# Patient Record
Sex: Female | Born: 1985 | Race: Black or African American | Hispanic: No | Marital: Single | Smoking: Current some day smoker
Health system: Southern US, Community
[De-identification: ages and names within clinical notes are randomized; demographics above are authoritative.]

## PROBLEM LIST (undated history)

## (undated) DIAGNOSIS — F1490 Cocaine use, unspecified, uncomplicated: Secondary | ICD-10-CM

## (undated) DIAGNOSIS — J45909 Unspecified asthma, uncomplicated: Secondary | ICD-10-CM

## (undated) DIAGNOSIS — Z789 Other specified health status: Secondary | ICD-10-CM

## (undated) HISTORY — PX: NO PAST SURGERIES: SHX2092

## (undated) HISTORY — DX: Unspecified asthma, uncomplicated: J45.909

---
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
## 2004-01-07 ENCOUNTER — Emergency Department (HOSPITAL_COMMUNITY): Admission: EM | Admit: 2004-01-07 | Discharge: 2004-01-07 | Payer: Self-pay | Admitting: Family Medicine

## 2004-02-10 ENCOUNTER — Emergency Department (HOSPITAL_COMMUNITY): Admission: EM | Admit: 2004-02-10 | Discharge: 2004-02-10 | Payer: Self-pay | Admitting: Family Medicine

## 2004-06-29 ENCOUNTER — Emergency Department (HOSPITAL_COMMUNITY): Admission: EM | Admit: 2004-06-29 | Discharge: 2004-06-29 | Payer: Self-pay | Admitting: Emergency Medicine

## 2004-07-16 ENCOUNTER — Emergency Department (HOSPITAL_COMMUNITY): Admission: EM | Admit: 2004-07-16 | Discharge: 2004-07-16 | Payer: Self-pay | Admitting: Family Medicine

## 2004-09-18 ENCOUNTER — Emergency Department (HOSPITAL_COMMUNITY): Admission: EM | Admit: 2004-09-18 | Discharge: 2004-09-19 | Payer: Self-pay | Admitting: *Deleted

## 2004-09-25 ENCOUNTER — Emergency Department (HOSPITAL_COMMUNITY): Admission: EM | Admit: 2004-09-25 | Discharge: 2004-09-25 | Payer: Self-pay | Admitting: Emergency Medicine

## 2005-03-03 ENCOUNTER — Emergency Department (HOSPITAL_COMMUNITY): Admission: EM | Admit: 2005-03-03 | Discharge: 2005-03-03 | Payer: Self-pay | Admitting: Emergency Medicine

## 2005-04-21 ENCOUNTER — Inpatient Hospital Stay (HOSPITAL_COMMUNITY): Admission: AD | Admit: 2005-04-21 | Discharge: 2005-04-21 | Payer: Self-pay | Admitting: Obstetrics

## 2005-05-18 ENCOUNTER — Emergency Department (HOSPITAL_COMMUNITY): Admission: EM | Admit: 2005-05-18 | Discharge: 2005-05-19 | Payer: Self-pay | Admitting: Emergency Medicine

## 2005-05-19 ENCOUNTER — Inpatient Hospital Stay (HOSPITAL_COMMUNITY): Admission: AD | Admit: 2005-05-19 | Discharge: 2005-05-20 | Payer: Self-pay | Admitting: Obstetrics

## 2005-06-14 ENCOUNTER — Inpatient Hospital Stay (HOSPITAL_COMMUNITY): Admission: AD | Admit: 2005-06-14 | Discharge: 2005-06-14 | Payer: Self-pay | Admitting: Obstetrics

## 2005-06-23 ENCOUNTER — Inpatient Hospital Stay (HOSPITAL_COMMUNITY): Admission: AD | Admit: 2005-06-23 | Discharge: 2005-06-23 | Payer: Self-pay | Admitting: Obstetrics

## 2005-07-12 ENCOUNTER — Inpatient Hospital Stay (HOSPITAL_COMMUNITY): Admission: AD | Admit: 2005-07-12 | Discharge: 2005-07-12 | Payer: Self-pay | Admitting: Obstetrics

## 2005-08-05 ENCOUNTER — Inpatient Hospital Stay (HOSPITAL_COMMUNITY): Admission: AD | Admit: 2005-08-05 | Discharge: 2005-08-05 | Payer: Self-pay | Admitting: Obstetrics

## 2005-08-30 ENCOUNTER — Inpatient Hospital Stay (HOSPITAL_COMMUNITY): Admission: AD | Admit: 2005-08-30 | Discharge: 2005-08-30 | Payer: Self-pay | Admitting: Obstetrics

## 2005-09-01 ENCOUNTER — Inpatient Hospital Stay (HOSPITAL_COMMUNITY): Admission: AD | Admit: 2005-09-01 | Discharge: 2005-09-02 | Payer: Self-pay | Admitting: Obstetrics

## 2005-10-07 ENCOUNTER — Observation Stay (HOSPITAL_COMMUNITY): Admission: AD | Admit: 2005-10-07 | Discharge: 2005-10-07 | Payer: Self-pay | Admitting: Obstetrics

## 2005-10-13 ENCOUNTER — Inpatient Hospital Stay (HOSPITAL_COMMUNITY): Admission: AD | Admit: 2005-10-13 | Discharge: 2005-10-13 | Payer: Self-pay | Admitting: Obstetrics

## 2005-10-19 ENCOUNTER — Inpatient Hospital Stay (HOSPITAL_COMMUNITY): Admission: AD | Admit: 2005-10-19 | Discharge: 2005-10-19 | Payer: Self-pay

## 2005-10-25 ENCOUNTER — Inpatient Hospital Stay (HOSPITAL_COMMUNITY): Admission: AD | Admit: 2005-10-25 | Discharge: 2005-10-27 | Payer: Self-pay | Admitting: Obstetrics

## 2006-05-31 ENCOUNTER — Emergency Department (HOSPITAL_COMMUNITY): Admission: EM | Admit: 2006-05-31 | Discharge: 2006-05-31 | Payer: Self-pay | Admitting: *Deleted

## 2006-07-11 ENCOUNTER — Emergency Department (HOSPITAL_COMMUNITY): Admission: EM | Admit: 2006-07-11 | Discharge: 2006-07-11 | Payer: Self-pay | Admitting: Emergency Medicine

## 2006-07-15 ENCOUNTER — Emergency Department (HOSPITAL_COMMUNITY): Admission: EM | Admit: 2006-07-15 | Discharge: 2006-07-15 | Payer: Self-pay | Admitting: Family Medicine

## 2006-08-27 ENCOUNTER — Emergency Department (HOSPITAL_COMMUNITY): Admission: EM | Admit: 2006-08-27 | Discharge: 2006-08-28 | Payer: Self-pay | Admitting: Emergency Medicine

## 2006-09-08 ENCOUNTER — Emergency Department (HOSPITAL_COMMUNITY): Admission: EM | Admit: 2006-09-08 | Discharge: 2006-09-08 | Payer: Self-pay | Admitting: Family Medicine

## 2006-11-16 ENCOUNTER — Emergency Department (HOSPITAL_COMMUNITY): Admission: EM | Admit: 2006-11-16 | Discharge: 2006-11-16 | Payer: Self-pay | Admitting: *Deleted

## 2007-06-04 ENCOUNTER — Emergency Department (HOSPITAL_COMMUNITY): Admission: EM | Admit: 2007-06-04 | Discharge: 2007-06-04 | Payer: Self-pay | Admitting: Family Medicine

## 2007-06-25 ENCOUNTER — Emergency Department (HOSPITAL_COMMUNITY): Admission: EM | Admit: 2007-06-25 | Discharge: 2007-06-25 | Payer: Self-pay | Admitting: Emergency Medicine

## 2007-08-12 ENCOUNTER — Emergency Department (HOSPITAL_COMMUNITY): Admission: EM | Admit: 2007-08-12 | Discharge: 2007-08-12 | Payer: Self-pay | Admitting: Emergency Medicine

## 2007-10-18 ENCOUNTER — Emergency Department (HOSPITAL_COMMUNITY): Admission: EM | Admit: 2007-10-18 | Discharge: 2007-10-19 | Payer: Self-pay | Admitting: Emergency Medicine

## 2008-01-23 ENCOUNTER — Emergency Department (HOSPITAL_COMMUNITY): Admission: EM | Admit: 2008-01-23 | Discharge: 2008-01-23 | Payer: Self-pay | Admitting: Emergency Medicine

## 2008-02-09 ENCOUNTER — Emergency Department (HOSPITAL_COMMUNITY): Admission: EM | Admit: 2008-02-09 | Discharge: 2008-02-10 | Payer: Self-pay | Admitting: Emergency Medicine

## 2008-05-20 ENCOUNTER — Emergency Department (HOSPITAL_COMMUNITY): Admission: EM | Admit: 2008-05-20 | Discharge: 2008-05-20 | Payer: Self-pay | Admitting: Emergency Medicine

## 2008-08-07 ENCOUNTER — Emergency Department (HOSPITAL_COMMUNITY): Admission: EM | Admit: 2008-08-07 | Discharge: 2008-08-07 | Payer: Self-pay | Admitting: Emergency Medicine

## 2008-08-15 ENCOUNTER — Emergency Department (HOSPITAL_COMMUNITY): Admission: EM | Admit: 2008-08-15 | Discharge: 2008-08-15 | Payer: Self-pay | Admitting: Emergency Medicine

## 2008-10-11 ENCOUNTER — Emergency Department (HOSPITAL_COMMUNITY): Admission: EM | Admit: 2008-10-11 | Discharge: 2008-10-11 | Payer: Self-pay | Admitting: Emergency Medicine

## 2008-11-16 ENCOUNTER — Inpatient Hospital Stay (HOSPITAL_COMMUNITY): Admission: AD | Admit: 2008-11-16 | Discharge: 2008-11-16 | Payer: Self-pay | Admitting: Obstetrics and Gynecology

## 2008-11-28 ENCOUNTER — Ambulatory Visit (HOSPITAL_COMMUNITY): Admission: RE | Admit: 2008-11-28 | Discharge: 2008-11-28 | Payer: Self-pay | Admitting: Obstetrics & Gynecology

## 2008-12-11 ENCOUNTER — Ambulatory Visit (HOSPITAL_COMMUNITY): Admission: RE | Admit: 2008-12-11 | Discharge: 2008-12-11 | Payer: Self-pay | Admitting: Obstetrics & Gynecology

## 2008-12-16 ENCOUNTER — Inpatient Hospital Stay (HOSPITAL_COMMUNITY): Admission: AD | Admit: 2008-12-16 | Discharge: 2008-12-16 | Payer: Self-pay | Admitting: Obstetrics & Gynecology

## 2009-01-03 ENCOUNTER — Ambulatory Visit (HOSPITAL_COMMUNITY): Admission: RE | Admit: 2009-01-03 | Discharge: 2009-01-03 | Payer: Self-pay | Admitting: Obstetrics & Gynecology

## 2009-01-15 ENCOUNTER — Inpatient Hospital Stay (HOSPITAL_COMMUNITY): Admission: AD | Admit: 2009-01-15 | Discharge: 2009-01-15 | Payer: Self-pay | Admitting: Obstetrics & Gynecology

## 2009-03-10 ENCOUNTER — Ambulatory Visit: Payer: Self-pay | Admitting: Obstetrics and Gynecology

## 2009-03-10 ENCOUNTER — Inpatient Hospital Stay (HOSPITAL_COMMUNITY): Admission: AD | Admit: 2009-03-10 | Discharge: 2009-03-10 | Payer: Self-pay | Admitting: Obstetrics & Gynecology

## 2009-10-09 ENCOUNTER — Emergency Department (HOSPITAL_COMMUNITY): Admission: EM | Admit: 2009-10-09 | Discharge: 2009-10-09 | Payer: Self-pay | Admitting: Family Medicine

## 2010-04-13 ENCOUNTER — Emergency Department (HOSPITAL_COMMUNITY)
Admission: EM | Admit: 2010-04-13 | Discharge: 2010-04-13 | Payer: Self-pay | Source: Home / Self Care | Admitting: Emergency Medicine

## 2010-07-06 LAB — URINALYSIS, ROUTINE W REFLEX MICROSCOPIC
Nitrite: NEGATIVE
Urobilinogen, UA: 1 mg/dL (ref 0.0–1.0)

## 2010-07-06 LAB — URINE MICROSCOPIC-ADD ON

## 2010-07-06 LAB — WET PREP, GENITAL
Trich, Wet Prep: NONE SEEN
Yeast Wet Prep HPF POC: NONE SEEN

## 2010-07-06 LAB — GC/CHLAMYDIA PROBE AMP, GENITAL: Chlamydia, DNA Probe: POSITIVE — AB

## 2010-07-06 LAB — POCT PREGNANCY, URINE: Preg Test, Ur: NEGATIVE

## 2010-07-29 LAB — URINALYSIS, ROUTINE W REFLEX MICROSCOPIC
Bilirubin Urine: NEGATIVE
Glucose, UA: NEGATIVE mg/dL
Ketones, ur: NEGATIVE mg/dL
Leukocytes, UA: NEGATIVE
Nitrite: NEGATIVE
Protein, ur: NEGATIVE mg/dL
Specific Gravity, Urine: 1.01 (ref 1.005–1.030)
Urobilinogen, UA: 0.2 mg/dL (ref 0.0–1.0)
pH: 7 (ref 5.0–8.0)

## 2010-07-29 LAB — RAPID URINE DRUG SCREEN, HOSP PERFORMED
Amphetamines: NOT DETECTED
Barbiturates: NOT DETECTED
Benzodiazepines: NOT DETECTED
Cocaine: POSITIVE — AB
Opiates: NOT DETECTED
Tetrahydrocannabinol: POSITIVE — AB

## 2010-07-29 LAB — URINE MICROSCOPIC-ADD ON

## 2010-07-31 LAB — URINALYSIS, ROUTINE W REFLEX MICROSCOPIC
Bilirubin Urine: NEGATIVE
Ketones, ur: NEGATIVE mg/dL
Nitrite: NEGATIVE
Protein, ur: NEGATIVE mg/dL
Urobilinogen, UA: 0.2 mg/dL (ref 0.0–1.0)

## 2010-08-01 LAB — WET PREP, GENITAL
Trich, Wet Prep: NONE SEEN
Yeast Wet Prep HPF POC: NONE SEEN

## 2010-08-01 LAB — URINALYSIS, ROUTINE W REFLEX MICROSCOPIC
Bilirubin Urine: NEGATIVE
Glucose, UA: NEGATIVE mg/dL
Ketones, ur: NEGATIVE mg/dL
Leukocytes, UA: NEGATIVE
Nitrite: NEGATIVE
Protein, ur: NEGATIVE mg/dL
Urobilinogen, UA: 0.2 mg/dL (ref 0.0–1.0)
pH: 7 (ref 5.0–8.0)

## 2010-08-01 LAB — URINE MICROSCOPIC-ADD ON

## 2010-08-02 LAB — URINALYSIS, ROUTINE W REFLEX MICROSCOPIC
Bilirubin Urine: NEGATIVE
Glucose, UA: NEGATIVE mg/dL
Ketones, ur: 15 mg/dL — AB
Nitrite: NEGATIVE
pH: 6.5 (ref 5.0–8.0)

## 2010-08-02 LAB — COMPREHENSIVE METABOLIC PANEL
ALT: 13 U/L (ref 0–35)
Alkaline Phosphatase: 56 U/L (ref 39–117)
CO2: 25 mEq/L (ref 19–32)
GFR calc non Af Amer: >60 mL/min (ref >60)
Glucose, Bld: 79 mg/dL (ref 70–99)
Potassium: 4 mEq/L (ref 3.5–5.1)
Sodium: 135 mEq/L (ref 135–145)
Total Bilirubin: 0.4 mg/dL (ref 0.3–1.2)

## 2010-08-02 LAB — URINE MICROSCOPIC-ADD ON

## 2010-08-02 LAB — CBC
Hemoglobin: 11.8 g/dL — ABNORMAL LOW (ref 12.0–15.0)
RBC: 4.16 MIL/uL (ref 3.87–5.11)

## 2010-08-03 LAB — URINALYSIS, ROUTINE W REFLEX MICROSCOPIC
Bilirubin Urine: NEGATIVE
Ketones, ur: 15 mg/dL — AB
Ketones, ur: 15 mg/dL — AB
Nitrite: NEGATIVE
Nitrite: NEGATIVE
Protein, ur: 30 mg/dL — AB
Protein, ur: 30 mg/dL — AB
Specific Gravity, Urine: 1.031 — ABNORMAL HIGH (ref 1.005–1.030)
Urobilinogen, UA: 1 mg/dL (ref 0.0–1.0)

## 2010-08-03 LAB — DIFFERENTIAL
Basophils Absolute: 0.1 10*3/uL (ref 0.0–0.1)
Basophils Relative: 2 % — ABNORMAL HIGH (ref 0–1)
Eosinophils Absolute: 0 10*3/uL (ref 0.0–0.7)
Eosinophils Relative: 0 % (ref 0–5)
Monocytes Absolute: 0.5 10*3/uL (ref 0.1–1.0)
Neutro Abs: 3.9 10*3/uL (ref 1.7–7.7)

## 2010-08-03 LAB — URINE MICROSCOPIC-ADD ON

## 2010-08-03 LAB — URINE CULTURE: Colony Count: >=100000

## 2010-08-03 LAB — GC/CHLAMYDIA PROBE AMP, GENITAL
Chlamydia, DNA Probe: NEGATIVE
GC Probe Amp, Genital: NEGATIVE

## 2010-08-03 LAB — CBC
Hemoglobin: 11.9 g/dL — ABNORMAL LOW (ref 12.0–15.0)
MCHC: 32.2 g/dL (ref 30.0–36.0)
RDW: 15 % (ref 11.5–15.5)

## 2010-08-03 LAB — WET PREP, GENITAL: Trich, Wet Prep: NONE SEEN

## 2010-08-05 LAB — ABO/RH: ABO/RH(D): A POS

## 2010-08-05 LAB — URINALYSIS, ROUTINE W REFLEX MICROSCOPIC
Ketones, ur: 15 mg/dL — AB
Leukocytes, UA: NEGATIVE
Protein, ur: 30 mg/dL — AB
Urobilinogen, UA: 0.2 mg/dL (ref 0.0–1.0)

## 2010-08-05 LAB — URINE MICROSCOPIC-ADD ON

## 2010-08-05 LAB — GC/CHLAMYDIA PROBE AMP, GENITAL: GC Probe Amp, Genital: POSITIVE — AB

## 2011-01-15 LAB — POCT RAPID STREP A: Streptococcus, Group A Screen (Direct): POSITIVE — AB

## 2011-01-18 LAB — HEPATIC FUNCTION PANEL
Alkaline Phosphatase: 56
Total Bilirubin: 0.2 — ABNORMAL LOW

## 2011-01-18 LAB — DIFFERENTIAL
Basophils Relative: 0
Eosinophils Absolute: 0.2
Eosinophils Relative: 3
Lymphs Abs: 3.1
Monocytes Relative: 5
Neutrophils Relative %: 49

## 2011-01-18 LAB — CBC
HCT: 36.9
MCHC: 32
MCV: 83.7
Platelets: 293
RBC: 4.41
WBC: 7.3

## 2011-01-18 LAB — POCT I-STAT CREATININE
Creatinine, Ser: 1.3 — ABNORMAL HIGH
Operator id: 277751

## 2011-01-18 LAB — I-STAT 8, (EC8 V) (CONVERTED LAB)
Acid-base deficit: 2
BUN: 11
Bicarbonate: 22.6
Chloride: 105
Glucose, Bld: 74
HCT: 41
Hemoglobin: 13.9
Operator id: 277751
Potassium: 3.6
Sodium: 138
TCO2: 24
pCO2, Ven: 37.5 — ABNORMAL LOW
pH, Ven: 7.387 — ABNORMAL HIGH

## 2011-01-18 LAB — URINALYSIS, ROUTINE W REFLEX MICROSCOPIC
Bilirubin Urine: NEGATIVE
Leukocytes, UA: NEGATIVE
Nitrite: NEGATIVE
Specific Gravity, Urine: 1.031 — ABNORMAL HIGH
Urobilinogen, UA: 0.2
pH: 5.5

## 2011-01-18 LAB — LIPASE, BLOOD: Lipase: 18

## 2011-01-18 LAB — URINE MICROSCOPIC-ADD ON

## 2011-01-19 LAB — POCT INFECTIOUS MONO SCREEN: Mono Screen: NEGATIVE

## 2011-01-21 LAB — DIFFERENTIAL
Basophils Absolute: 0
Basophils Relative: 0
Monocytes Absolute: 0.9
Neutro Abs: 10.1 — ABNORMAL HIGH

## 2011-01-21 LAB — URINE CULTURE

## 2011-01-21 LAB — CBC
HCT: 32.9 — ABNORMAL LOW
Hemoglobin: 10.9 — ABNORMAL LOW
Platelets: 296
RBC: 3.95
WBC: 11.8 — ABNORMAL HIGH

## 2011-01-21 LAB — COMPREHENSIVE METABOLIC PANEL
Albumin: 3.3 — ABNORMAL LOW
Alkaline Phosphatase: 57
BUN: 6
CO2: 25
Chloride: 106
GFR calc non Af Amer: >60
Glucose, Bld: 94
Potassium: 3.4 — ABNORMAL LOW
Total Bilirubin: 0.5

## 2011-01-21 LAB — URINALYSIS, ROUTINE W REFLEX MICROSCOPIC
Bilirubin Urine: NEGATIVE
Ketones, ur: NEGATIVE
Nitrite: POSITIVE — AB
Specific Gravity, Urine: 1.012
Urobilinogen, UA: 1

## 2011-01-26 LAB — RAPID STREP SCREEN (MED CTR MEBANE ONLY): Streptococcus, Group A Screen (Direct): POSITIVE — AB

## 2011-02-08 LAB — CBC
HCT: 39.8
Hemoglobin: 13.3
MCHC: 33.5
MCV: 81.7
Platelets: 340
RBC: 4.86
RDW: 15.4 — ABNORMAL HIGH
WBC: 9.7

## 2011-02-08 LAB — POCT I-STAT CREATININE: Operator id: 272551

## 2011-02-08 LAB — DIFFERENTIAL
Basophils Absolute: 0
Basophils Relative: 1
Eosinophils Absolute: 0.1
Eosinophils Relative: 1
Lymphocytes Relative: 22
Lymphs Abs: 2.1
Monocytes Absolute: 0.7
Monocytes Relative: 7
Neutro Abs: 6.7
Neutrophils Relative %: 69

## 2011-02-08 LAB — I-STAT 8, (EC8 V) (CONVERTED LAB)
Bicarbonate: 23.1
Glucose, Bld: 93
TCO2: 24
pH, Ven: 7.538 — ABNORMAL HIGH

## 2018-04-26 NOTE — L&D Delivery Note (Addendum)
Delivery Note At 8:37 PM a viable female was delivered via Vaginal, Spontaneous (Presentation: ROA).  APGAR: 9, 9; weight pending.   Placenta status: spontaneous and intact,  Cord: 3 vessel cord, nuchal x2 and body. Delivered through and reduced after delivery  Anesthesia:  none Episiotomy: None Lacerations:  None  Suture Repair: n/a Est. Blood Loss (mL):  177  Mom to postpartum.  Baby to Couplet care / Skin to Skin.  Lattie Haw MD 01/14/2019, 8:55 PM  Patient is a 33 y.o. at [redacted]w[redacted]d who was admitted spontaneous onset of labor, significant hx of limited prenatal care, hx PTD x3 with one 24w demise.  She progressed without augmentation.  I was gloved and present for delivery in its entirety.  Second stage of labor progressed, baby delivered after 30 minutes of pushing.   Complications: nuchal x2, body x1  Lacerations: none  Wende Mott, CNM 11:29 PM

## 2018-12-19 ENCOUNTER — Encounter (HOSPITAL_COMMUNITY): Payer: Self-pay

## 2018-12-19 ENCOUNTER — Inpatient Hospital Stay (HOSPITAL_COMMUNITY)
Admission: AD | Admit: 2018-12-19 | Discharge: 2018-12-19 | Disposition: A | Discharge status: Home / Self Care | Payer: Medicaid Other | Attending: Obstetrics and Gynecology | Admitting: Obstetrics and Gynecology

## 2018-12-19 ENCOUNTER — Inpatient Hospital Stay (HOSPITAL_BASED_OUTPATIENT_CLINIC_OR_DEPARTMENT_OTHER): Payer: Medicaid Other

## 2018-12-19 ENCOUNTER — Other Ambulatory Visit: Payer: Self-pay

## 2018-12-19 DIAGNOSIS — Z880 Allergy status to penicillin: Secondary | ICD-10-CM | POA: Diagnosis not present

## 2018-12-19 DIAGNOSIS — O99323 Drug use complicating pregnancy, third trimester: Secondary | ICD-10-CM

## 2018-12-19 DIAGNOSIS — Z3A35 35 weeks gestation of pregnancy: Secondary | ICD-10-CM

## 2018-12-19 DIAGNOSIS — Z8249 Family history of ischemic heart disease and other diseases of the circulatory system: Secondary | ICD-10-CM | POA: Diagnosis not present

## 2018-12-19 DIAGNOSIS — O09523 Supervision of elderly multigravida, third trimester: Secondary | ICD-10-CM

## 2018-12-19 DIAGNOSIS — F17210 Nicotine dependence, cigarettes, uncomplicated: Secondary | ICD-10-CM | POA: Insufficient documentation

## 2018-12-19 DIAGNOSIS — O4703 False labor before 37 completed weeks of gestation, third trimester: Secondary | ICD-10-CM | POA: Diagnosis not present

## 2018-12-19 DIAGNOSIS — O09213 Supervision of pregnancy with history of pre-term labor, third trimester: Secondary | ICD-10-CM

## 2018-12-19 DIAGNOSIS — O99333 Smoking (tobacco) complicating pregnancy, third trimester: Secondary | ICD-10-CM

## 2018-12-19 DIAGNOSIS — N939 Abnormal uterine and vaginal bleeding, unspecified: Secondary | ICD-10-CM | POA: Diagnosis present

## 2018-12-19 DIAGNOSIS — O479 False labor, unspecified: Secondary | ICD-10-CM

## 2018-12-19 DIAGNOSIS — Z883 Allergy status to other anti-infective agents status: Secondary | ICD-10-CM | POA: Insufficient documentation

## 2018-12-19 DIAGNOSIS — O4700 False labor before 37 completed weeks of gestation, unspecified trimester: Secondary | ICD-10-CM

## 2018-12-19 HISTORY — DX: Other specified health status: Z78.9

## 2018-12-19 LAB — URINALYSIS, ROUTINE W REFLEX MICROSCOPIC
Bilirubin Urine: NEGATIVE
Glucose, UA: NEGATIVE mg/dL
Hgb urine dipstick: NEGATIVE
Ketones, ur: NEGATIVE mg/dL
Leukocytes,Ua: NEGATIVE
Nitrite: NEGATIVE
Protein, ur: NEGATIVE mg/dL
Specific Gravity, Urine: 1.008 (ref 1.005–1.030)
pH: 7 (ref 5.0–8.0)

## 2018-12-19 LAB — CBC
HCT: 30.5 % — ABNORMAL LOW (ref 36.0–46.0)
Hemoglobin: 9.9 g/dL — ABNORMAL LOW (ref 12.0–15.0)
MCH: 26.2 pg (ref 26.0–34.0)
MCHC: 32.5 g/dL (ref 30.0–36.0)
MCV: 80.7 fL (ref 80.0–100.0)
Platelets: 244 10*3/uL (ref 150–400)
RBC: 3.78 MIL/uL — ABNORMAL LOW (ref 3.87–5.11)
RDW: 13.9 % (ref 11.5–15.5)
WBC: 7.3 10*3/uL (ref 4.0–10.5)
nRBC: 0 % (ref 0.0–0.2)

## 2018-12-19 LAB — DIFFERENTIAL
Abs Immature Granulocytes: 0.05 10*3/uL (ref 0.00–0.07)
Basophils Absolute: 0 10*3/uL (ref 0.0–0.1)
Basophils Relative: 0 %
Eosinophils Absolute: 0.1 10*3/uL (ref 0.0–0.5)
Eosinophils Relative: 2 %
Immature Granulocytes: 1 %
Lymphocytes Relative: 22 %
Lymphs Abs: 1.6 10*3/uL (ref 0.7–4.0)
Monocytes Absolute: 0.7 10*3/uL (ref 0.1–1.0)
Monocytes Relative: 9 %
Neutro Abs: 4.8 10*3/uL (ref 1.7–7.7)
Neutrophils Relative %: 66 %

## 2018-12-19 LAB — RAPID HIV SCREEN (HIV 1/2 AB+AG)
HIV 1/2 Antibodies: NONREACTIVE
HIV-1 P24 Antigen - HIV24: NONREACTIVE

## 2018-12-19 LAB — TYPE AND SCREEN
ABO/RH(D): A POS
Antibody Screen: NEGATIVE

## 2018-12-19 LAB — WET PREP, GENITAL
Sperm: NONE SEEN
Trich, Wet Prep: NONE SEEN
Yeast Wet Prep HPF POC: NONE SEEN

## 2018-12-19 LAB — RAPID URINE DRUG SCREEN, HOSP PERFORMED
Amphetamines: NOT DETECTED
Barbiturates: NOT DETECTED
Benzodiazepines: NOT DETECTED
Cocaine: NOT DETECTED
Opiates: NOT DETECTED
Tetrahydrocannabinol: POSITIVE — AB

## 2018-12-19 IMAGING — US US MFM OB DETAIL +14 WK
1 series · 13 of 28 positions shown · non-contrast
Comparison: none

[Series 1: us mfm ob detail +14 wk · 84 acquisitions, 13 frames shown]
[im 4/84]
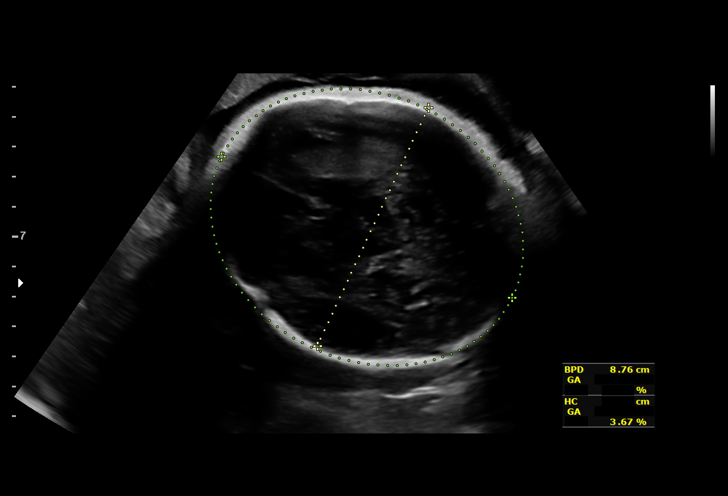
[im 10/84]
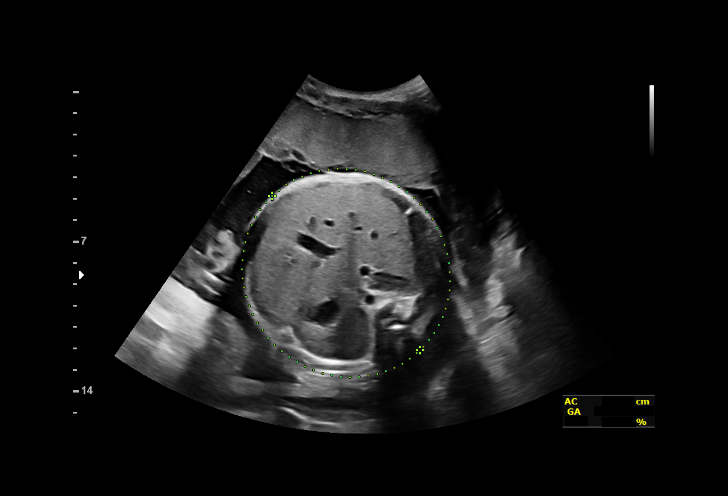
[im 16/84]
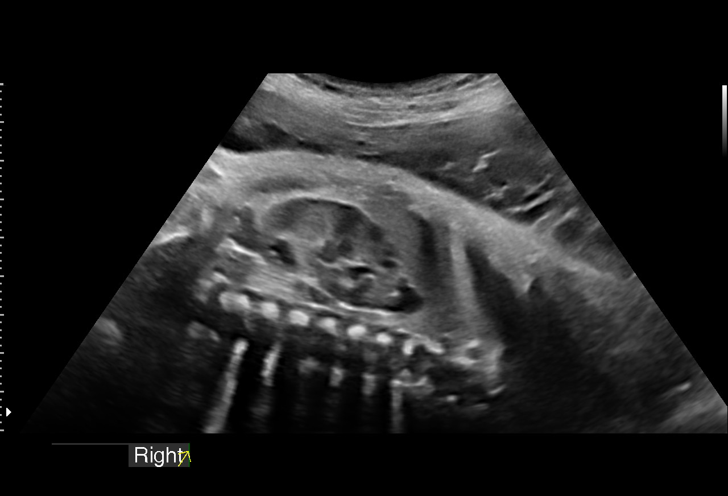
[im 22/84]
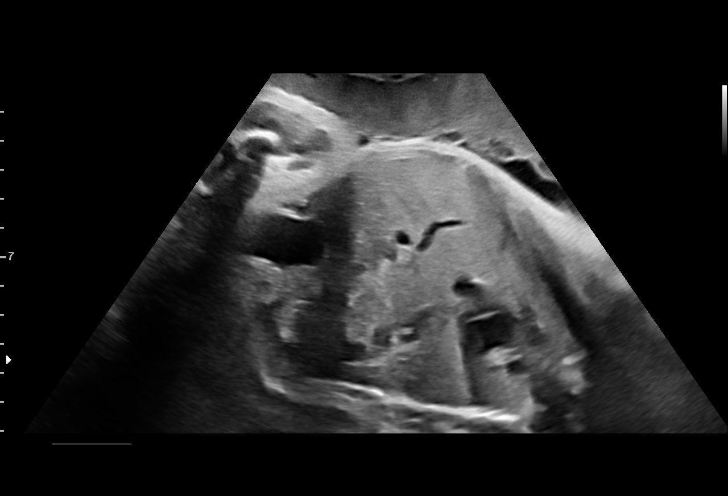
[im 28/84]
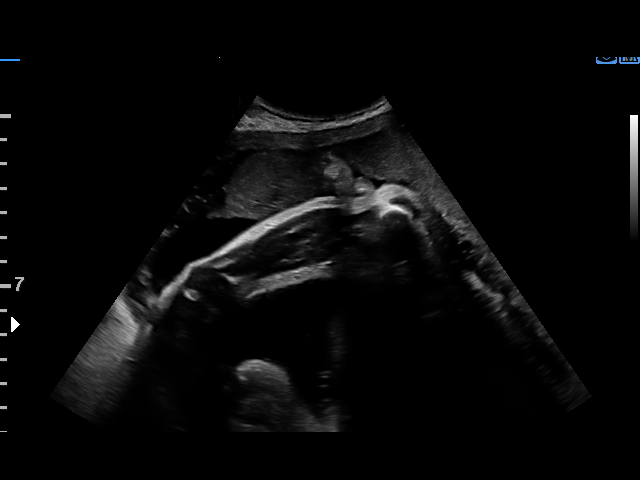
[im 34/84]
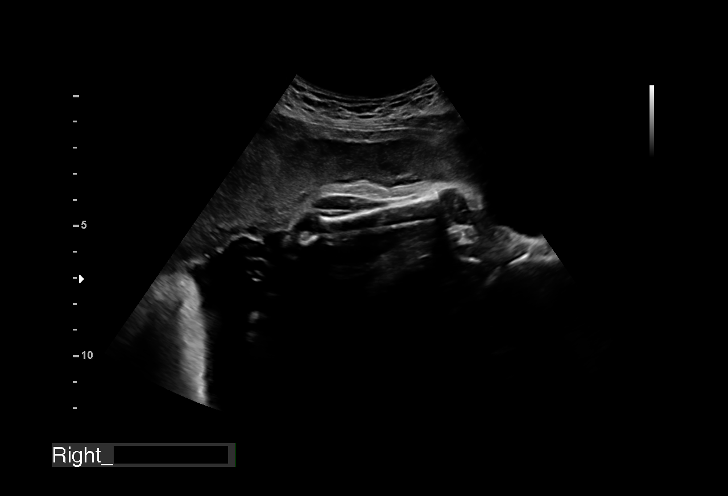
[im 44/84]
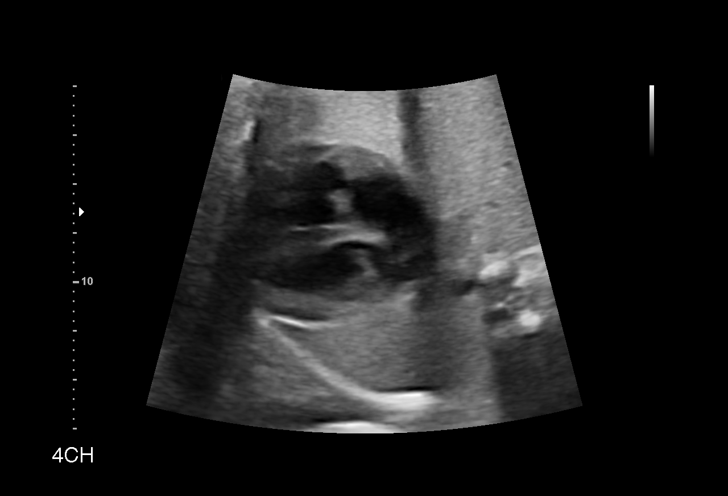
[im 50/84]
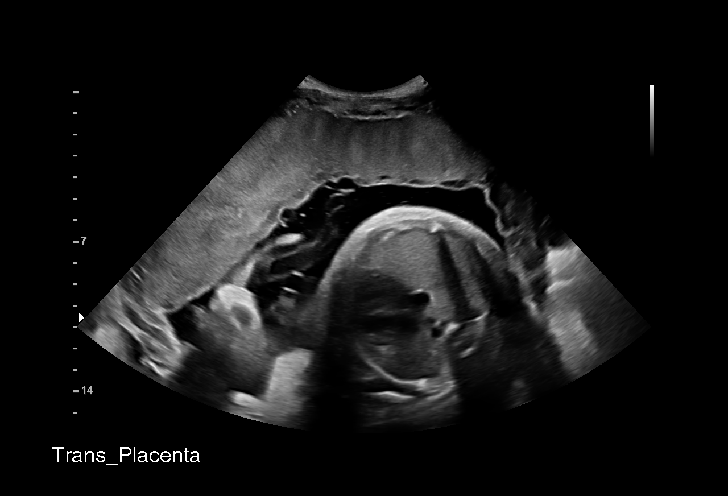
[im 56/84]
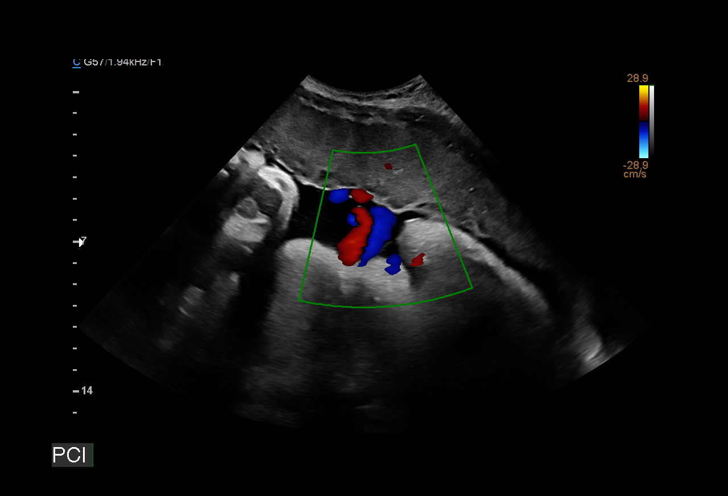
[im 62/84]
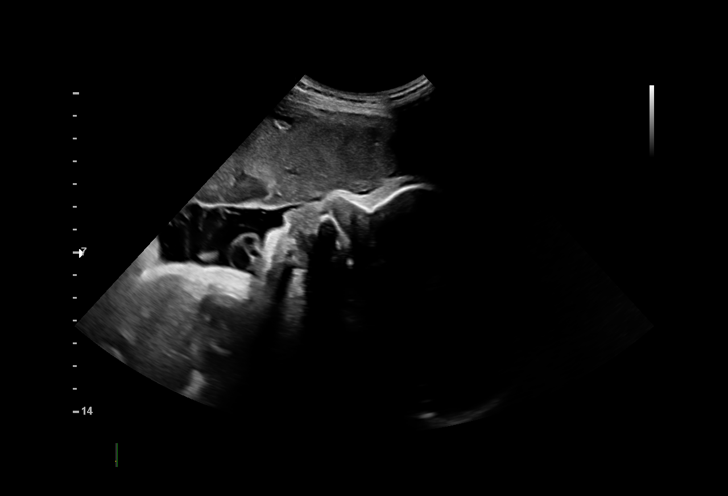
[im 68/84]
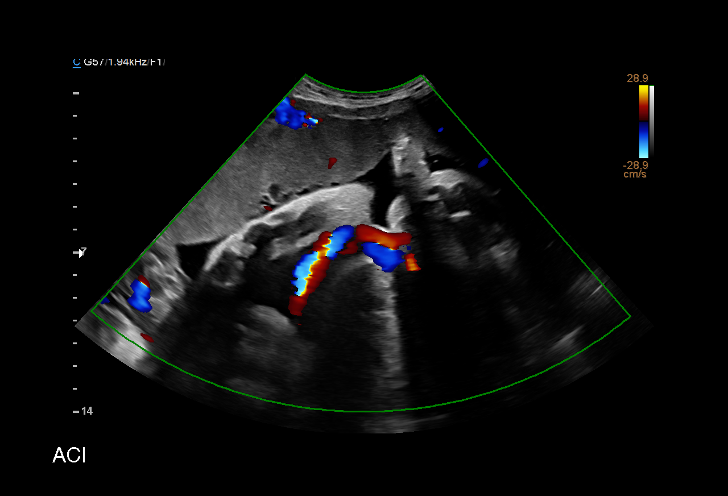
[im 74/84]
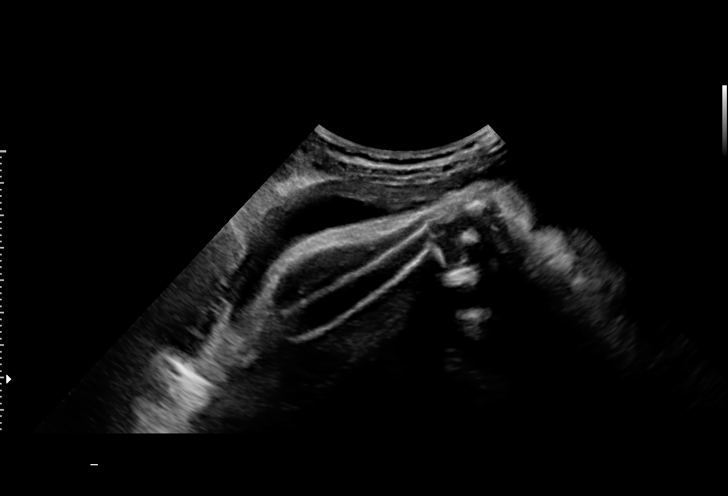
[im 80/84]
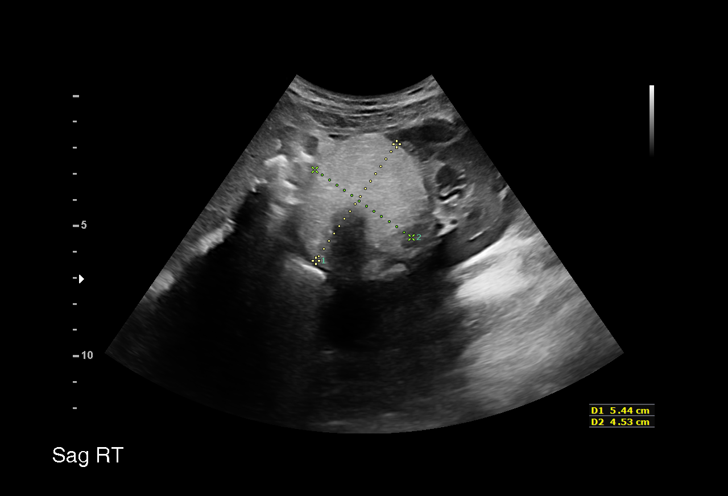

[13 of 28 positions shown; findings below may reference images not displayed]

Referred By:      SPIRITOVIC

 ----------------------------------------------------------------------

 ----------------------------------------------------------------------
Indications

  Preterm contractions                           [QZ]
  35 weeks gestation of pregnancy
  Poor obstetric history: Previous preterm       [QZ]
  delivery, antepartum (x3; 24w, 36w, 35w)
  Drug use complicating pregnancy, third         [QZ]
  trimester (last use crack/cocaine was 3.5-4
  months ago)
  Marijuana Use (occasionally)
  Encounter for antenatal screening for          [QZ]
  malformations
  Smoking complicating pregnancy, third          [QZ]
  trimester
  Advanced maternal age multigravida 35+,        [QZ]
  third trimester
 ----------------------------------------------------------------------
Fetal Evaluation

 Num Of Fetuses:         1
 Fetal Heart Rate(bpm):  132
 Cardiac Activity:       Observed
 Presentation:           Cephalic
 Placenta:               Anterior
 P. Cord Insertion:      Visualized

 Amniotic Fluid
 AFI FV:      Within normal limits

 AFI Sum(cm)     %Tile       Largest Pocket(cm)
 12.52           40

 RUQ(cm)       RLQ(cm)       LUQ(cm)        LLQ(cm)

Biometry

 BPD:        88  mm     G. Age:  35w 4d         49  %    CI:        80.54   %    70 - 86
                                                         FL/HC:      20.4   %    20.1 -
 HC:      309.7  mm     G. Age:  34w 4d          4  %    HC/AC:      1.00        0.93 -
 AC:      308.2  mm     G. Age:  34w 5d         28  %    FL/BPD:     71.9   %    71 - 87
 FL:       63.3  mm     G. Age:  32w 5d        < 1  %    FL/AC:      20.5   %    20 - 24
 HUM:      54.9  mm     G. Age:  32w 0d        < 5  %

 LV:        8.2  mm

 Est. FW:    [QZ]  gm      5 lb 4 oz     13  %
OB History

 Gravidity:    6         Term:   2        Prem:   3
 Living:       4
Gestational Age

 Clinical EDD:  35w 6d                                        EDD:   [DATE]
 U/S Today:     34w 3d                                        EDD:   [DATE]
 Best:          35w 6d     Det. By:  Clinical EDD             EDD:   [DATE]
Anatomy

 Cranium:               Appears normal         LVOT:                   Appears normal
 Cavum:                 Not well visualized    Aortic Arch:            Not well visualized
 Ventricles:            Appears normal         Ductal Arch:            Not well visualized
 Choroid Plexus:        Appears normal         Diaphragm:              Appears normal
 Cerebellum:            Not well visualized    Stomach:                Appears normal, left
                                                                       sided
 Posterior Fossa:       Not well visualized    Abdomen:                Appears normal
 Nuchal Fold:           Not applicable (>20    Abdominal Wall:         Appears nml (cord
                        wks GA)                                        insert, abd wall)
 Face:                  Appears normal         Cord Vessels:           Appears normal (3
                        (orbits and profile)                           vessel cord)
 Lips:                  Appears normal         Kidneys:                Appear normal
 Palate:                Not well visualized    Bladder:                Appears normal
 Thoracic:              Appears normal         Spine:                  Not well visualized
 Heart:                 Appears normal         Upper Extremities:      Appears normal
                        (4CH, axis, and
                        situs)
 RVOT:                  Appears normal         Lower Extremities:      Appears normal

 Other:  Hands and feet nwv.  Nasal bone visualized. Technically difficult due
         to advanced GA and fetal position.
Cervix Uterus Adnexa

 Cervix
 Not visualized (advanced GA >[QZ])

 Uterus
 No abnormality visualized.

 Left Ovary
 Not visualized. No adnexal mass visualized.
 Right Ovary
 Not visualized. Rt adnexal mass seen = [QZ]
Impression

 Normal interval growth.  No ultrasonic evidence of structural
 fetal anomalies.
 Suboptimal views of the fetal anatomy obtained secondary to
 fetal position.
 Likely dermoid ovary 5x3 cm seen previously on pelvic
 ultrasounds.
 EFW 13%
Recommendations

 Repeat growth in 4 weeks given substance abuse history and
 EFW
 Follow up dermoid ovary in the postnatal period

## 2018-12-19 MED ORDER — METRONIDAZOLE 500 MG PO TABS: 500.0000 mg | ORAL_TABLET | Freq: Two times a day (BID) | ORAL | 0 refills | Status: AC

## 2018-12-19 MED ORDER — FERROUS SULFATE 325 (65 FE) MG PO TABS: 325.0000 mg | ORAL_TABLET | ORAL | 3 refills | Status: DC

## 2018-12-19 MED ORDER — ACETAMINOPHEN 500 MG PO TABS
1000.0000 mg | ORAL_TABLET | Freq: Once | ORAL | Status: AC
  Administered 2018-12-19: 1000 mg via ORAL
  Filled 2018-12-19: qty 2

## 2018-12-19 MED ORDER — ACETAMINOPHEN 500 MG PO TABS: 1000.0000 mg | ORAL_TABLET | Freq: Three times a day (TID) | ORAL | 0 refills | Status: DC | PRN

## 2018-12-19 NOTE — MAU Provider Note (Signed)
Chief Complaint:  Contractions and Emesis  First Provider Initiated Contact with Patient 12/19/18 1241      HPI: Rachel Stanley is a 33 y.o. [id number] at [redacted]w[redacted]d by LMP, early ultrasound (per patient report) who presents to maternity admissions reporting contractions and vaginal bleeding. Patient reports receiving care at [facility] from 8w to 22w pregnant. She reports the center then closed and she has not had [place] since that time. Last seen in May 2020. Contractions started yesterday and have increased in strength and frequency since that time. Early this morning she noticed some "bloody show" of streaks on a tissue. She then began to feel nauseous and decided to seek care. Minimal vaginal discharge. Reports headache, dizziness and vomiting since this morning. Reporting pressure in her back. Ctx about every 6 min per patient and rating at 6/10. She reports good fetal movement, denies LOF, vaginal itching/burning, urinary symptoms or fever/chills.  Complications in previous pregnancies include a stroke in 2011 pregnancy and was told "blood pressure was high." In the beginning of this pregnancy, she reports using crack cocaine. Continues to occasionally smoke cigarettes and THC. Denies being told of any complications this pregnancy.   Past Medical History: Past Medical History:  Diagnosis Date  . Medical history non-contributory     Past obstetric history: OB History  Gravida Para Term Preterm AB Living  6 5 2 3   4   SAB TAB Ectopic Multiple Live Births          5    # Outcome Date GA Lbr Len/2nd Weight Sex Delivery Anes PTL Lv  6 Current           5 Preterm 2017 [redacted]w[redacted]d    Vag-Spont   LIV  4 Preterm 2011 [redacted]w[redacted]d    Vag-Spont   LIV  3 Preterm 2010 [redacted]w[redacted]d    Vag-Spont   ND  2 Term 2007     Vag-Spont   LIV  1 Term 2001     Vag-Spont   LIV    Past Surgical History: Past Surgical History:  Procedure Laterality Date  . NO PAST SURGERIES      Family History: Family History   Problem Relation Age of Onset  . Heart disease Mother   . Heart disease Father     Social History: Social History   Tobacco Use  . Smoking status: Current Some Day Smoker    Types: Cigarettes  . Smokeless tobacco: Former Network engineer Use Topics  . Alcohol use: Not Currently  . Drug use: Not Currently    Types: "Crack" cocaine, Marijuana    Comment: clast crack/cocaine was 3.5-4 months, marijuanna occasionally    Allergies:  Allergies  Allergen Reactions  . Macrobid [Nitrofurantoin] Hives  . Penicillins     States she was young but what she remembers is swelling and fever    Meds:  No medications prior to admission.    ROS:  Review of Systems All other systems negative unless noted above in HPI.   I have reviewed patient's Past Medical Hx, Surgical Hx, Family Hx, Social Hx, medications and allergies.   Physical Exam   Patient Vitals for the past 24 hrs:  BP Temp Temp src Pulse Resp SpO2  12/19/18 1535 135/71 - - 89 - -  12/19/18 1226 120/69 98.8 F (37.1 C) Oral 84 18 100 %   Constitutional: Well-developed, well-nourished female in no acute distress.  Cardiovascular: normal rate Respiratory: normal effort GI: Abd soft, non-tender, gravid appropriate  for gestational age.  MS: Extremities nontender, no edema, normal ROM Neurologic: Alert and oriented x 4.  GU: Neg CVAT. PELVIC EXAM: Cervix pink, visually closed, without lesion, moderate amount of white creamy discharge, vaginal walls and external genitalia normal. No blood noted. No pooling of fluids. Bimanual exam: Cervix 0.5/50%/high, firm, posterior, neg CMT, uterus nontender, nonenlarged, adnexa without tenderness, enlargement, or mass  Dilation: 1 Effacement (%): 50 Station: -3 Presentation: Vertex Exam by:: dr c Stevan Eberwein  FHT:  Baseline 135 , moderate variability, accelerations present, no decelerations Contractions: q 6 mins, patient talking through them and only feeling intermittently     Labs: Results for orders placed or performed during the hospital encounter of 12/19/18 (from the past 24 hour(s))  Wet prep, genital     Status: Abnormal   Collection Time: 12/19/18  1:08 PM   Specimen: Genital  Result Value Ref Range   Yeast Wet Prep HPF POC NONE SEEN NONE SEEN   Trich, Wet Prep NONE SEEN NONE SEEN   Clue Cells Wet Prep HPF POC PRESENT (A) NONE SEEN   WBC, Wet Prep HPF POC MANY (A) NONE SEEN   Sperm NONE SEEN   Urinalysis, Routine w reflex microscopic     Status: Abnormal   Collection Time: 12/19/18  1:24 PM  Result Value Ref Range   Color, Urine STRAW (A) YELLOW   APPearance CLEAR CLEAR   Specific Gravity, Urine 1.008 1.005 - 1.030   pH 7.0 5.0 - 8.0   Glucose, UA NEGATIVE NEGATIVE mg/dL   Hgb urine dipstick NEGATIVE NEGATIVE   Bilirubin Urine NEGATIVE NEGATIVE   Ketones, ur NEGATIVE NEGATIVE mg/dL   Protein, ur NEGATIVE NEGATIVE mg/dL   Nitrite NEGATIVE NEGATIVE   Leukocytes,Ua NEGATIVE NEGATIVE  Rapid urine drug screen (hospital performed)     Status: Abnormal   Collection Time: 12/19/18  1:24 PM  Result Value Ref Range   Opiates NONE DETECTED NONE DETECTED   Cocaine NONE DETECTED NONE DETECTED   Benzodiazepines NONE DETECTED NONE DETECTED   Amphetamines NONE DETECTED NONE DETECTED   Tetrahydrocannabinol POSITIVE (A) NONE DETECTED   Barbiturates NONE DETECTED NONE DETECTED  CBC     Status: Abnormal   Collection Time: 12/19/18  1:32 PM  Result Value Ref Range   WBC 7.3 4.0 - 10.5 K/uL   RBC 3.78 (L) 3.87 - 5.11 MIL/uL   Hemoglobin 9.9 (L) 12.0 - 15.0 g/dL   HCT 98.130.5 (L) 19.136.0 - 47.846.0 %   MCV 80.7 80.0 - 100.0 fL   MCH 26.2 26.0 - 34.0 pg   MCHC 32.5 30.0 - 36.0 g/dL   RDW 29.513.9 62.111.5 - 30.815.5 %   Platelets 244 150 - 400 K/uL   nRBC 0.0 0.0 - 0.2 %  Differential     Status: None   Collection Time: 12/19/18  1:32 PM  Result Value Ref Range   Neutrophils Relative % 66 %   Neutro Abs 4.8 1.7 - 7.7 K/uL   Lymphocytes Relative 22 %   Lymphs Abs 1.6 0.7 -  4.0 K/uL   Monocytes Relative 9 %   Monocytes Absolute 0.7 0.1 - 1.0 K/uL   Eosinophils Relative 2 %   Eosinophils Absolute 0.1 0.0 - 0.5 K/uL   Basophils Relative 0 %   Basophils Absolute 0.0 0.0 - 0.1 K/uL   Immature Granulocytes 1 %   Abs Immature Granulocytes 0.05 0.00 - 0.07 K/uL  Rapid HIV screen (HIV 1/2 Ab+Ag)     Status: None  Collection Time: 12/19/18  1:32 PM  Result Value Ref Range   HIV-1 P24 Antigen - HIV24 NON REACTIVE NON REACTIVE   HIV 1/2 Antibodies NON REACTIVE NON REACTIVE   Interpretation (HIV Ag Ab)      A non reactive test result means that HIV 1 or HIV 2 antibodies and HIV 1 p24 antigen were not detected in the specimen.  Type and screen     Status: None   Collection Time: 12/19/18  1:35 PM  Result Value Ref Range   ABO/RH(D) A POS    Antibody Screen NEG    Sample Expiration      12/22/2018,2359 Performed at [facility], [address] [place], [place] [address]    --/--/A POS (08/25 1335)  Imaging:  No results found. US obtained: See scanned report. GA 35.6, cephalic, anterior placenta, AFI WNL, 2390g, 13%EFW. No abnormalities noted; formal read pending.   MAU Course/MDM: Orders Placed This Encounter  Procedures  . Wet prep, genital  . Culture, beta strep (group b only)  . [place] MFM OB DETAIL +14 WK  . Urinalysis, Routine w reflex microscopic  . Hepatitis B surface antigen  . Rubella screen  . RPR  . CBC  . Differential  . HIV antibody (routine testing)  . Rapid HIV screen (HIV 1/2 Ab+Ag)  . Rapid urine drug screen (hospital performed)  . Type and screen  . Discharge patient Discharge disposition: 01-Home or Self Care; Discharge patient date: 12/19/2018    Meds ordered this encounter  Medications  . acetaminophen (TYLENOL) tablet 1,000 mg  . acetaminophen (TYLENOL) 500 MG tablet    Sig: Take 2 tablets (1,000 mg total) by mouth every 8 (eight) hours as needed.    Dispense:  30 tablet    Refill:  0  . metroNIDAZOLE (FLAGYL) 500 MG  tablet    Sig: Take 1 tablet (500 mg total) by mouth 2 (two) times daily for 14 days.    Dispense:  28 tablet    Refill:  0  . ferrous sulfate 325 (65 FE) MG tablet    Sig: Take 1 tablet (325 mg total) by mouth every other day.    Dispense:  90 tablet    Refill:  3     Hx of preterm deliveries, drug abuse and current THC/tobacco user. NST reviewed. Cat I. Ctx noted but patient comfortable throughout. US obtained and WNL. Cervical exam not indicative of preterm labor. BV noted, Flagyl prescribed on discharge. OB Panel drawn as patient with no prenatal care that could be found in Care Everywhere. GBS culture drawn as almost 36 w and at higher risk for PTL by hx. UA negative. UDS positive for THC only. CBC remarkable for Hgb 9.9 and Plt 244. Will start on ferrous sulfate. Patient felt comfortable discharging home and reported her headache improved. BP's normal throughout stay. Patient given info to f/u at [facility]. Treatments in MAU included 1000 mg Tylenol.   Pt discharged with strict return precautions.  Assessment: 1. Preterm uterine contractions in third trimester, antepartum   2. Preterm uterine contractions     Plan: Discharge home Labor precautions and fetal kick counts Follow-up Information    [facility] for [facility]. Schedule an appointment as soon as possible for a visit in 1 week(s).   Specialty: Obstetrics and Gynecology Contact information: [address] [place] [address], [address] [id number] mc [place] [address] [phone]         Allergies as of 12/19/2018  Reactions   Macrobid [nitrofurantoin] Hives   Penicillins    States she was young but what she remembers is swelling and fever      Medication List    TAKE these medications   acetaminophen 500 MG tablet Commonly known as: TYLENOL Take 2 tablets (1,000 mg total) by mouth every 8 (eight) hours as needed.   ferrous sulfate 325 (65 FE) MG tablet Take 1 tablet (325 mg  total) by mouth every other day.   metroNIDAZOLE 500 MG tablet Commonly known as: FLAGYL Take 1 tablet (500 mg total) by mouth 2 (two) times daily for 14 days.      Jerilynn Birkenheadhelsea Addie Cederberg, MD [facility] Family Medicine Fellow, [facility], [facility] 12/19/2018 3:44 PM

## 2018-12-19 NOTE — MAU Note (Signed)
Rachel Stanley is a 33 y.o. at [redacted]w[redacted]d here in MAU reporting: reports uc's since last night, they are about 5-7 min. Also saw some bloody show. States last baby came around this time. States she had been getting care [facility] but has not been seen since covid started. + FM, no LOF. Also emesis x 4 today  Onset of complaint: last night  Pain score: 7/10  Vitals:   12/19/18 1226  BP: 120/69  Pulse: 84  Resp: 18  Temp: 98.8 F (37.1 C)  SpO2: 100%      Lab orders placed from triage: UA

## 2018-12-20 ENCOUNTER — Other Ambulatory Visit: Payer: Self-pay | Admitting: Family Medicine

## 2018-12-20 DIAGNOSIS — Z3A36 36 weeks gestation of pregnancy: Secondary | ICD-10-CM

## 2018-12-20 LAB — HIV ANTIBODY (ROUTINE TESTING W REFLEX): HIV Screen 4th Generation wRfx: NONREACTIVE

## 2018-12-20 LAB — HEPATITIS B SURFACE ANTIGEN: Hepatitis B Surface Ag: NEGATIVE

## 2018-12-20 LAB — RUBELLA SCREEN: Rubella: 6.64 index (ref >0.99)

## 2018-12-20 LAB — RPR: RPR Ser Ql: NONREACTIVE

## 2018-12-21 LAB — CULTURE, BETA STREP (GROUP B ONLY)

## 2018-12-21 LAB — CERVICOVAGINAL ANCILLARY ONLY
Chlamydia: NEGATIVE
Neisseria Gonorrhea: NEGATIVE
Trichomonas: NEGATIVE

## 2018-12-22 ENCOUNTER — Other Ambulatory Visit: Payer: Self-pay

## 2018-12-22 ENCOUNTER — Inpatient Hospital Stay (HOSPITAL_COMMUNITY)
Admission: AD | Admit: 2018-12-22 | Discharge: 2018-12-22 | Disposition: A | Discharge status: Home / Self Care | Payer: Medicaid Other | Attending: Obstetrics and Gynecology | Admitting: Obstetrics and Gynecology

## 2018-12-22 ENCOUNTER — Encounter (HOSPITAL_COMMUNITY): Payer: Self-pay | Admitting: *Deleted

## 2018-12-22 DIAGNOSIS — O26893 Other specified pregnancy related conditions, third trimester: Secondary | ICD-10-CM | POA: Diagnosis not present

## 2018-12-22 DIAGNOSIS — F17210 Nicotine dependence, cigarettes, uncomplicated: Secondary | ICD-10-CM | POA: Insufficient documentation

## 2018-12-22 DIAGNOSIS — R51 Headache: Secondary | ICD-10-CM

## 2018-12-22 DIAGNOSIS — G44209 Tension-type headache, unspecified, not intractable: Secondary | ICD-10-CM | POA: Insufficient documentation

## 2018-12-22 DIAGNOSIS — Z3689 Encounter for other specified antenatal screening: Secondary | ICD-10-CM

## 2018-12-22 DIAGNOSIS — Z880 Allergy status to penicillin: Secondary | ICD-10-CM | POA: Diagnosis not present

## 2018-12-22 DIAGNOSIS — O99332 Smoking (tobacco) complicating pregnancy, second trimester: Secondary | ICD-10-CM | POA: Insufficient documentation

## 2018-12-22 DIAGNOSIS — Z3A36 36 weeks gestation of pregnancy: Secondary | ICD-10-CM | POA: Diagnosis not present

## 2018-12-22 LAB — URINALYSIS, ROUTINE W REFLEX MICROSCOPIC
Bilirubin Urine: NEGATIVE
Glucose, UA: NEGATIVE mg/dL
Hgb urine dipstick: NEGATIVE
Ketones, ur: NEGATIVE mg/dL
Leukocytes,Ua: NEGATIVE
Nitrite: NEGATIVE
Protein, ur: NEGATIVE mg/dL
Specific Gravity, Urine: 1.008 (ref 1.005–1.030)
pH: 6 (ref 5.0–8.0)

## 2018-12-22 LAB — COMPREHENSIVE METABOLIC PANEL
ALT: 11 U/L (ref 0–44)
AST: 19 U/L (ref 15–41)
Albumin: 2.7 g/dL — ABNORMAL LOW (ref 3.5–5.0)
Alkaline Phosphatase: 148 U/L — ABNORMAL HIGH (ref 38–126)
Anion gap: 9 (ref 5–15)
BUN: 6 mg/dL (ref 6–20)
CO2: 22 mmol/L (ref 22–32)
Calcium: 8.2 mg/dL — ABNORMAL LOW (ref 8.9–10.3)
Chloride: 104 mmol/L (ref 98–111)
Creatinine, Ser: 0.76 mg/dL (ref 0.44–1.00)
GFR calc Af Amer: >60 mL/min (ref >60)
GFR calc non Af Amer: >60 mL/min (ref >60)
Glucose, Bld: 79 mg/dL (ref 70–99)
Potassium: 4.5 mmol/L (ref 3.5–5.1)
Sodium: 135 mmol/L (ref 135–145)
Total Bilirubin: 0.2 mg/dL — ABNORMAL LOW (ref 0.3–1.2)
Total Protein: 6.4 g/dL — ABNORMAL LOW (ref 6.5–8.1)

## 2018-12-22 LAB — CBC
HCT: 33.1 % — ABNORMAL LOW (ref 36.0–46.0)
Hemoglobin: 10.7 g/dL — ABNORMAL LOW (ref 12.0–15.0)
MCH: 26.6 pg (ref 26.0–34.0)
MCHC: 32.3 g/dL (ref 30.0–36.0)
MCV: 82.3 fL (ref 80.0–100.0)
Platelets: 280 10*3/uL (ref 150–400)
RBC: 4.02 MIL/uL (ref 3.87–5.11)
RDW: 14.4 % (ref 11.5–15.5)
WBC: 8.2 10*3/uL (ref 4.0–10.5)
nRBC: 0 % (ref 0.0–0.2)

## 2018-12-22 LAB — PROTEIN / CREATININE RATIO, URINE
Creatinine, Urine: 63.42 mg/dL
Protein Creatinine Ratio: 0.2 mg/mg{Cre} — ABNORMAL HIGH (ref 0.00–0.15)
Total Protein, Urine: 13 mg/dL

## 2018-12-22 MED ORDER — DIPHENHYDRAMINE HCL 50 MG/ML IJ SOLN
25.0000 mg | Freq: Once | INTRAMUSCULAR | Status: AC
  Administered 2018-12-22: 25 mg via INTRAVENOUS
  Filled 2018-12-22: qty 1

## 2018-12-22 MED ORDER — LACTATED RINGERS IV BOLUS
1000.0000 mL | Freq: Once | INTRAVENOUS | Status: AC
  Administered 2018-12-22: 1000 mL via INTRAVENOUS

## 2018-12-22 MED ORDER — METOCLOPRAMIDE HCL 5 MG/ML IJ SOLN
10.0000 mg | Freq: Once | INTRAMUSCULAR | Status: AC
  Administered 2018-12-22: 10 mg via INTRAVENOUS
  Filled 2018-12-22: qty 2

## 2018-12-22 MED ORDER — DEXAMETHASONE SODIUM PHOSPHATE 10 MG/ML IJ SOLN
10.0000 mg | Freq: Once | INTRAMUSCULAR | Status: AC
  Administered 2018-12-22: 10 mg via INTRAVENOUS
  Filled 2018-12-22: qty 1

## 2018-12-22 NOTE — Progress Notes (Signed)
Monitors readjusted, then pt got up to use the bathroom.

## 2018-12-22 NOTE — Discharge Instructions (Signed)
Tension Headache, Adult A tension headache is pain, pressure, or aching in your head. Tension headaches can last from 30 minutes to several days. Follow these instructions at home: Managing pain  Take over-the-counter and prescription medicines only as told by your doctor.  When you have a headache, lie down in a dark, quiet room.  If told, put ice on your head and neck: ? Put ice in a plastic bag. ? Place a towel between your skin and the bag. ? Leave the ice on for 20 minutes, 2-3 times a day.  If told, put heat on the back of your neck. Do this as often as your doctor tells you to. Use the kind of heat that your doctor recommends, such as a moist heat pack or a heating pad. ? Place a towel between your skin and the heat. ? Leave the heat on for 20-30 minutes. ? Remove the heat if your skin turns bright red. Eating and drinking  Eat meals on a regular schedule.  Watch how much alcohol you drink: ? If you are a woman and are not pregnant, do not drink more than 1 drink a day. ? If you are a man, do not drink more than 2 drinks a day.  Drink enough fluid to keep your pee (urine) pale yellow.  Do not use a lot of caffeine, or stop using caffeine. Lifestyle  Get enough sleep. Get 7-9 hours of sleep each night. Or get the amount of sleep that your doctor tells you to.  At bedtime, remove all electronic devices from your room. Examples of electronic devices are computers, phones, and tablets.  Find ways to lessen your stress. Some things that can lessen stress are: ? Exercise. ? Deep breathing. ? Yoga. ? Music. ? Positive thoughts.  Sit up straight. Do not tighten (tense) your muscles.  Do not use any products that have nicotine or tobacco in them, such as cigarettes and e-cigarettes. If you need help quitting, ask your doctor. General instructions   Keep all follow-up visits as told by your doctor. This is important.  Avoid things that can bring on headaches. Keep a  journal to find out if certain things bring on headaches. For example, write down: ? What you eat and drink. ? How much sleep you get. ? Any change to your diet or medicines. Contact a doctor if:  Your headache does not get better.  Your headache comes back.  You have a headache and sounds, light, or smells bother you.  You feel sick to your stomach (nauseous) or you throw up (vomit).  Your stomach hurts. Get help right away if:  You suddenly get a very bad headache along with any of these: ? A stiff neck. ? Feeling sick to your stomach. ? Throwing up. ? Feeling weak. ? Trouble seeing. ? Feeling short of breath. ? A rash. ? Feeling unusually sleepy. ? Trouble speaking. ? Pain in your eye or ear. ? Trouble walking or balancing. ? Feeling like you will pass out (faint). ? Passing out. Summary  A tension headache is pain, pressure, or aching in your head.  Tension headaches can last from 30 minutes to several days.  Lifestyle changes and medicines may help relieve pain. This information is not intended to replace advice given to you by your health care provider. Make sure you discuss any questions you have with your health care provider. Document Released: 07/07/2009 Document Revised: 03/25/2017 Document Reviewed: 07/23/2016 Elsevier Patient Education  2020 Elsevier   Inc.  

## 2018-12-22 NOTE — MAU Note (Signed)
Pt states headaches started Monday and has tried tylenol with no relief. Pt feels the vomiting is a side effect from the pain of the headache. Denies vag bleeding or discharge, state she has felt normal fetal movement.

## 2018-12-22 NOTE — MAU Provider Note (Signed)
History     CSN: [id number]  Arrival date and time: 12/22/18 1519   First Provider Initiated Contact with Patient 12/22/18 1607      Chief Complaint  Patient presents with  . Headache   33 y.o. [id number] @36 .2 wks presenting with HA. Reports onset 4 days ago. Located frontal. Rates 7/10. Has tried Tylenol but isn't helping. Took a dose of Aleve today which helped some. Has N/V when HA worsens. Denies visual disturbances and epigastric pain. Reports good FM. No VB, LOF, or regular ctx.   OB History    Gravida  6   Para  5   Term  2   Preterm  3   AB      Living  4     SAB      TAB      Ectopic      Multiple      Live Births  5           Past Medical History:  Diagnosis Date  . Medical history non-contributory     Past Surgical History:  Procedure Laterality Date  . NO PAST SURGERIES      Family History  Problem Relation Age of Onset  . Heart disease Mother   . Heart disease Father     Social History   Tobacco Use  . Smoking status: Current Some Day Smoker    Types: Cigarettes  . Smokeless tobacco: Former Engineer, waterUser  Substance Use Topics  . Alcohol use: Not Currently  . Drug use: Not Currently    Types: Marijuana, "Crack" cocaine    Comment: clast crack/cocaine was 3.5-4 months, marijuanna occasionally,,    Allergies:  Allergies  Allergen Reactions  . Macrobid [Nitrofurantoin] Hives  . Penicillins     States she was young but what she remembers is swelling and fever    Medications Prior to Admission  Medication Sig Dispense Refill Last Dose  . acetaminophen (TYLENOL) 500 MG tablet Take 2 tablets (1,000 mg total) by mouth every 8 (eight) hours as needed. 30 tablet 0 12/22/2018 at 1100  . ferrous sulfate 325 (65 FE) MG tablet Take 1 tablet (325 mg total) by mouth every other day. 90 tablet 3 12/21/2018 at Unknown time  . metroNIDAZOLE (FLAGYL) 500 MG tablet Take 1 tablet (500 mg total) by mouth 2 (two) times daily for 14 days. 28 tablet 0  12/22/2018 at Unknown time    Review of Systems  Eyes: Negative for photophobia.  Gastrointestinal: Positive for nausea and vomiting. Negative for abdominal pain.  Genitourinary: Negative for vaginal bleeding and vaginal discharge.  Neurological: Positive for headaches.   Physical Exam   Blood pressure 112/83, pulse 89, temperature 98.1 F (36.7 C), temperature source Oral, resp. rate 18, height 5\' 4"  (1.626 m), weight 66.7 kg, SpO2 98 %. Patient Vitals for the past 24 hrs:  BP Temp Temp src Pulse Resp SpO2 Height Weight  12/22/18 1846 112/83 - - 89 - - - -  12/22/18 1812 111/67 - - 83 - 98 % - -  12/22/18 1749 114/63 - - 80 - - - -  12/22/18 1544 104/63 - - 82 - - - -  12/22/18 1543 104/63 98.1 F (36.7 C) Oral 92 18 100 % 5\' 4"  (1.626 m) 66.7 kg  12/22/18 1542 - - - - - 100 % - -    Physical Exam  Nursing note and vitals reviewed. Constitutional: She is oriented to person, place, and time. She appears well-developed  and well-nourished. No distress.  HENT:  Head: Normocephalic and atraumatic.  Neck: Normal range of motion.  Cardiovascular: Normal rate.  Respiratory: Effort normal. No respiratory distress.  Musculoskeletal: Normal range of motion.        General: No edema.  Neurological: She is alert and oriented to person, place, and time.  Skin: Skin is warm and dry.  Psychiatric: She has a normal mood and affect.  EFM: 135 bpm, mod variability, + accels, no decels Toco: rare  Results for orders placed or performed during the hospital encounter of 12/22/18 (from the past 24 hour(s))  Protein / creatinine ratio, urine     Status: Abnormal   Collection Time: 12/22/18  4:17 PM  Result Value Ref Range   Creatinine, Urine 63.42 mg/dL   Total Protein, Urine 13 mg/dL   Protein Creatinine Ratio 0.20 (H) 0.00 - 0.15 mg/mg[Cre]  Urinalysis, Routine w reflex microscopic     Status: None   Collection Time: 12/22/18  4:18 PM  Result Value Ref Range   Color, Urine YELLOW YELLOW    APPearance CLEAR CLEAR   Specific Gravity, Urine 1.008 1.005 - 1.030   pH 6.0 5.0 - 8.0   Glucose, UA NEGATIVE NEGATIVE mg/dL   Hgb urine dipstick NEGATIVE NEGATIVE   Bilirubin Urine NEGATIVE NEGATIVE   Ketones, ur NEGATIVE NEGATIVE mg/dL   Protein, ur NEGATIVE NEGATIVE mg/dL   Nitrite NEGATIVE NEGATIVE   Leukocytes,Ua NEGATIVE NEGATIVE  CBC     Status: Abnormal   Collection Time: 12/22/18  4:57 PM  Result Value Ref Range   WBC 8.2 4.0 - 10.5 K/uL   RBC 4.02 3.87 - 5.11 MIL/uL   Hemoglobin 10.7 (L) 12.0 - 15.0 g/dL   HCT 33.1 (L) 36.0 - 46.0 %   MCV 82.3 80.0 - 100.0 fL   MCH 26.6 26.0 - 34.0 pg   MCHC 32.3 30.0 - 36.0 g/dL   RDW 14.4 11.5 - 15.5 %   Platelets 280 150 - 400 K/uL   nRBC 0.0 0.0 - 0.2 %  Comprehensive metabolic panel     Status: Abnormal   Collection Time: 12/22/18  4:57 PM  Result Value Ref Range   Sodium 135 135 - 145 mmol/L   Potassium 4.5 3.5 - 5.1 mmol/L   Chloride 104 98 - 111 mmol/L   CO2 22 22 - 32 mmol/L   Glucose, Bld 79 70 - 99 mg/dL   BUN 6 6 - 20 mg/dL   Creatinine, Ser 0.76 0.44 - 1.00 mg/dL   Calcium 8.2 (L) 8.9 - 10.3 mg/dL   Total Protein 6.4 (L) 6.5 - 8.1 g/dL   Albumin 2.7 (L) 3.5 - 5.0 g/dL   AST 19 15 - 41 U/L   ALT 11 0 - 44 U/L   Alkaline Phosphatase 148 (H) 38 - 126 U/L   Total Bilirubin 0.2 (L) 0.3 - 1.2 mg/dL   GFR calc non Af Amer >60 >60 mL/min   GFR calc Af Amer >60 >60 mL/min   Anion gap 9 5 - 15   MAU Course  Procedures Meds ordered this encounter  Medications  . lactated ringers bolus 1,000 mL  . metoCLOPramide (REGLAN) injection 10 mg  . dexamethasone (DECADRON) injection 10 mg  . diphenhydrAMINE (BENADRYL) injection 25 mg   Orders Placed This Encounter  Procedures  . Urinalysis, Routine w reflex microscopic    Standing Status:   Standing    Number of Occurrences:   1  . CBC  Standing Status:   Standing    Number of Occurrences:   1  . Comprehensive metabolic panel    Standing Status:   Standing     Number of Occurrences:   1  . Protein / creatinine ratio, urine    Standing Status:   Standing    Number of Occurrences:   1  . Discharge patient    Order Specific Question:   Discharge disposition    Answer:   01-Home or Self Care [1]    Order Specific Question:   Discharge patient date    Answer:   12/22/2018   MDM Labs ordered and reviewed. No evidence of PEC. HA resolved with meds. Stable for discharge home.  Assessment and Plan   1. [redacted] weeks gestation of pregnancy   2. Acute non intractable tension-type headache   3. NST (non-stress test) reactive    Discharge home Follow up at [facility] to start care- pt to return call to clinic Return precautions  Allergies as of 12/22/2018      Reactions   Macrobid [nitrofurantoin] Hives   Penicillins    States she was young but what she remembers is swelling and fever      Medication List    TAKE these medications   acetaminophen 500 MG tablet Commonly known as: TYLENOL Take 2 tablets (1,000 mg total) by mouth every 8 (eight) hours as needed.   ferrous sulfate 325 (65 FE) MG tablet Take 1 tablet (325 mg total) by mouth every other day.   metroNIDAZOLE 500 MG tablet Commonly known as: FLAGYL Take 1 tablet (500 mg total) by mouth 2 (two) times daily for 14 days.      Donette Larry, CNM 12/22/2018, 6:57 PM

## 2018-12-22 NOTE — Progress Notes (Signed)
Lots of fetal movement and maternal repositioning for comfort.

## 2019-01-08 ENCOUNTER — Telehealth: Payer: Self-pay | Admitting: Advanced Practice Midwife

## 2019-01-08 ENCOUNTER — Encounter: Payer: Self-pay | Admitting: Advanced Practice Midwife

## 2019-01-08 ENCOUNTER — Encounter: Payer: Medicaid Other | Admitting: Advanced Practice Midwife

## 2019-01-08 NOTE — Telephone Encounter (Signed)
Attempted to call patient about her missed appointment on 9/14. No answer, number stated that the call could not be completed. No show letter mailed.

## 2019-01-10 ENCOUNTER — Telehealth: Payer: Self-pay | Admitting: Family Medicine

## 2019-01-10 NOTE — Telephone Encounter (Signed)
Received a call from Rachel Stanley about her appointment she missed because she has been out of town. She stated she just got back today, and would like to get rescheduled. Patient was given an appointment for the next available. She stated she had no symptoms.

## 2019-01-14 ENCOUNTER — Encounter (HOSPITAL_COMMUNITY): Payer: Self-pay | Admitting: Anesthesiology

## 2019-01-14 ENCOUNTER — Encounter (HOSPITAL_COMMUNITY): Payer: Self-pay

## 2019-01-14 ENCOUNTER — Inpatient Hospital Stay (HOSPITAL_COMMUNITY)
Admission: AD | Admit: 2019-01-14 | Discharge: 2019-01-16 | DRG: 806 | Disposition: A | Discharge status: Home / Self Care | Payer: Medicaid Other | Attending: Obstetrics and Gynecology | Admitting: Obstetrics and Gynecology

## 2019-01-14 DIAGNOSIS — Z20828 Contact with and (suspected) exposure to other viral communicable diseases: Secondary | ICD-10-CM | POA: Diagnosis present

## 2019-01-14 DIAGNOSIS — Z3A39 39 weeks gestation of pregnancy: Secondary | ICD-10-CM | POA: Diagnosis not present

## 2019-01-14 DIAGNOSIS — F1410 Cocaine abuse, uncomplicated: Secondary | ICD-10-CM | POA: Diagnosis present

## 2019-01-14 DIAGNOSIS — O99334 Smoking (tobacco) complicating childbirth: Secondary | ICD-10-CM | POA: Diagnosis present

## 2019-01-14 DIAGNOSIS — F17210 Nicotine dependence, cigarettes, uncomplicated: Secondary | ICD-10-CM | POA: Diagnosis present

## 2019-01-14 DIAGNOSIS — Z23 Encounter for immunization: Secondary | ICD-10-CM | POA: Diagnosis not present

## 2019-01-14 DIAGNOSIS — O99324 Drug use complicating childbirth: Secondary | ICD-10-CM | POA: Diagnosis present

## 2019-01-14 DIAGNOSIS — O26893 Other specified pregnancy related conditions, third trimester: Secondary | ICD-10-CM | POA: Diagnosis present

## 2019-01-14 LAB — RAPID URINE DRUG SCREEN, HOSP PERFORMED
Amphetamines: NOT DETECTED
Barbiturates: NOT DETECTED
Benzodiazepines: NOT DETECTED
Cocaine: NOT DETECTED
Opiates: NOT DETECTED
Tetrahydrocannabinol: POSITIVE — AB

## 2019-01-14 LAB — CBC
HCT: 34.1 % — ABNORMAL LOW (ref 36.0–46.0)
Hemoglobin: 11.1 g/dL — ABNORMAL LOW (ref 12.0–15.0)
MCH: 26.9 pg (ref 26.0–34.0)
MCHC: 32.6 g/dL (ref 30.0–36.0)
MCV: 82.6 fL (ref 80.0–100.0)
Platelets: 314 10*3/uL (ref 150–400)
RBC: 4.13 MIL/uL (ref 3.87–5.11)
RDW: 16.3 % — ABNORMAL HIGH (ref 11.5–15.5)
WBC: 6.9 10*3/uL (ref 4.0–10.5)
nRBC: 0 % (ref 0.0–0.2)

## 2019-01-14 LAB — SARS CORONAVIRUS 2 BY RT PCR (HOSPITAL ORDER, PERFORMED IN ~~LOC~~ HOSPITAL LAB): SARS Coronavirus 2: NEGATIVE

## 2019-01-14 LAB — TYPE AND SCREEN
ABO/RH(D): A POS
Antibody Screen: NEGATIVE

## 2019-01-14 MED ORDER — ACETAMINOPHEN 325 MG PO TABS
650.0000 mg | ORAL_TABLET | ORAL | Status: DC | PRN
  Administered 2019-01-14: 650 mg via ORAL
  Filled 2019-01-14: qty 2

## 2019-01-14 MED ORDER — OXYTOCIN 40 UNITS IN NORMAL SALINE INFUSION - SIMPLE MED
2.5000 [IU]/h | INTRAVENOUS | Status: DC
  Filled 2019-01-14: qty 1000

## 2019-01-14 MED ORDER — DIBUCAINE (PERIANAL) 1 % EX OINT: 1.0000 "application " | TOPICAL_OINTMENT | CUTANEOUS | Status: DC | PRN

## 2019-01-14 MED ORDER — ONDANSETRON HCL 4 MG/2ML IJ SOLN: 4.0000 mg | Freq: Four times a day (QID) | INTRAMUSCULAR | Status: DC | PRN

## 2019-01-14 MED ORDER — SENNOSIDES-DOCUSATE SODIUM 8.6-50 MG PO TABS
2.0000 | ORAL_TABLET | ORAL | Status: DC
  Administered 2019-01-14 – 2019-01-15 (×2): 2 via ORAL
  Filled 2019-01-14 (×2): qty 2

## 2019-01-14 MED ORDER — COCONUT OIL OIL: 1.0000 "application " | TOPICAL_OIL | Status: DC | PRN

## 2019-01-14 MED ORDER — WITCH HAZEL-GLYCERIN EX PADS: 1.0000 "application " | MEDICATED_PAD | CUTANEOUS | Status: DC | PRN

## 2019-01-14 MED ORDER — PRENATAL MULTIVITAMIN CH
1.0000 | ORAL_TABLET | Freq: Every day | ORAL | Status: DC
  Administered 2019-01-15 – 2019-01-16 (×2): 1 via ORAL
  Filled 2019-01-14 (×2): qty 1

## 2019-01-14 MED ORDER — PHENYLEPHRINE 40 MCG/ML (10ML) SYRINGE FOR IV PUSH (FOR BLOOD PRESSURE SUPPORT): 80.0000 ug | PREFILLED_SYRINGE | INTRAVENOUS | Status: DC | PRN

## 2019-01-14 MED ORDER — OXYCODONE HCL 5 MG PO TABS
5.0000 mg | ORAL_TABLET | ORAL | Status: DC | PRN
  Administered 2019-01-15 (×3): 5 mg via ORAL
  Filled 2019-01-14 (×3): qty 1

## 2019-01-14 MED ORDER — EPHEDRINE 5 MG/ML INJ: 10.0000 mg | INTRAVENOUS | Status: DC | PRN

## 2019-01-14 MED ORDER — FENTANYL-BUPIVACAINE-NACL 0.5-0.125-0.9 MG/250ML-% EP SOLN
12.0000 mL/h | EPIDURAL | Status: DC | PRN
  Filled 2019-01-14: qty 250

## 2019-01-14 MED ORDER — BENZOCAINE-MENTHOL 20-0.5 % EX AERO: 1.0000 "application " | INHALATION_SPRAY | CUTANEOUS | Status: DC | PRN

## 2019-01-14 MED ORDER — FLEET ENEMA 7-19 GM/118ML RE ENEM: 1.0000 | ENEMA | RECTAL | Status: DC | PRN

## 2019-01-14 MED ORDER — SIMETHICONE 80 MG PO CHEW: 80.0000 mg | CHEWABLE_TABLET | ORAL | Status: DC | PRN

## 2019-01-14 MED ORDER — OXYCODONE-ACETAMINOPHEN 5-325 MG PO TABS: 2.0000 | ORAL_TABLET | ORAL | Status: DC | PRN

## 2019-01-14 MED ORDER — SOD CITRATE-CITRIC ACID 500-334 MG/5ML PO SOLN: 30.0000 mL | ORAL | Status: DC | PRN

## 2019-01-14 MED ORDER — LACTATED RINGERS IV SOLN: 500.0000 mL | INTRAVENOUS | Status: DC | PRN

## 2019-01-14 MED ORDER — ZOLPIDEM TARTRATE 5 MG PO TABS: 5.0000 mg | ORAL_TABLET | Freq: Every evening | ORAL | Status: DC | PRN

## 2019-01-14 MED ORDER — ONDANSETRON HCL 4 MG PO TABS: 4.0000 mg | ORAL_TABLET | ORAL | Status: DC | PRN

## 2019-01-14 MED ORDER — LACTATED RINGERS IV SOLN: 500.0000 mL | Freq: Once | INTRAVENOUS | Status: DC

## 2019-01-14 MED ORDER — LACTATED RINGERS IV SOLN
500.0000 mL | Freq: Once | INTRAVENOUS | Status: AC
  Administered 2019-01-14: 500 mL via INTRAVENOUS

## 2019-01-14 MED ORDER — IBUPROFEN 600 MG PO TABS
600.0000 mg | ORAL_TABLET | Freq: Four times a day (QID) | ORAL | Status: DC
  Administered 2019-01-14 – 2019-01-16 (×7): 600 mg via ORAL
  Filled 2019-01-14 (×7): qty 1

## 2019-01-14 MED ORDER — OXYTOCIN BOLUS FROM INFUSION
500.0000 mL | Freq: Once | INTRAVENOUS | Status: AC
  Administered 2019-01-14: 500 mL via INTRAVENOUS

## 2019-01-14 MED ORDER — ACETAMINOPHEN 325 MG PO TABS
650.0000 mg | ORAL_TABLET | ORAL | Status: DC | PRN
  Administered 2019-01-15 (×3): 650 mg via ORAL
  Filled 2019-01-14 (×3): qty 2

## 2019-01-14 MED ORDER — LACTATED RINGERS IV SOLN
INTRAVENOUS | Status: DC
  Administered 2019-01-14 (×2): via INTRAVENOUS

## 2019-01-14 MED ORDER — LIDOCAINE HCL (PF) 1 % IJ SOLN: 30.0000 mL | INTRAMUSCULAR | Status: DC | PRN

## 2019-01-14 MED ORDER — OXYCODONE-ACETAMINOPHEN 5-325 MG PO TABS: 1.0000 | ORAL_TABLET | ORAL | Status: DC | PRN

## 2019-01-14 MED ORDER — DIPHENHYDRAMINE HCL 25 MG PO CAPS: 25.0000 mg | ORAL_CAPSULE | Freq: Four times a day (QID) | ORAL | Status: DC | PRN

## 2019-01-14 MED ORDER — ONDANSETRON HCL 4 MG/2ML IJ SOLN: 4.0000 mg | INTRAMUSCULAR | Status: DC | PRN

## 2019-01-14 MED ORDER — DIPHENHYDRAMINE HCL 50 MG/ML IJ SOLN: 12.5000 mg | INTRAMUSCULAR | Status: DC | PRN

## 2019-01-14 NOTE — Anesthesia Preprocedure Evaluation (Deleted)
Anesthesia Evaluation  Patient identified by MRN, date of birth, ID band Patient awake    Reviewed: Allergy & Precautions, Patient's Chart, lab work & pertinent test results  Airway Mallampati: II  TM Distance: >3 FB Neck ROM: Full    Dental no notable dental hx. (+) Teeth Intact   Pulmonary Current Smoker,    Pulmonary exam normal breath sounds clear to auscultation       Cardiovascular negative cardio ROS Normal cardiovascular exam Rhythm:Regular Rate:Normal     Neuro/Psych negative neurological ROS  negative psych ROS   GI/Hepatic GERD  ,(+)     substance abuse  marijuana use,   Endo/Other  negative endocrine ROS  Renal/GU negative Renal ROS  negative genitourinary   Musculoskeletal negative musculoskeletal ROS (+)   Abdominal   Peds  Hematology  (+) anemia ,   Anesthesia Other Findings   Reproductive/Obstetrics (+) Pregnancy                             Anesthesia Physical Anesthesia Plan  ASA: II  Anesthesia Plan: Epidural   Post-op Pain Management:    Induction:   PONV Risk Score and Plan:   Airway Management Planned: Natural Airway  Additional Equipment:   Intra-op Plan:   Post-operative Plan:   Informed Consent: I have reviewed the patients History and Physical, chart, labs and discussed the procedure including the risks, benefits and alternatives for the proposed anesthesia with the patient or authorized representative who has indicated his/her understanding and acceptance.       Plan Discussed with: Anesthesiologist  Anesthesia Plan Comments: (Patient felt urge to push when I arrived in the room. Examined by RN, fully dilated. No procedure performed. M. Royce Macadamia, MD)       Anesthesia Quick Evaluation

## 2019-01-14 NOTE — Discharge Summary (Addendum)
Postpartum Discharge Summary      Patient Name: Rachel Stanley DOB: 09-04-1985 MRN: [id number]  Date of admission: 01/14/2019 Delivering Provider: Lattie Haw   Date of discharge: 01/16/2019  Admitting diagnosis: water broke Intrauterine pregnancy: [redacted]w[redacted]d    Secondary diagnosis:  Active Problems:   Indication for care in labor or delivery  Additional problems: n/a     Discharge diagnosis: Term Pregnancy Delivered                                                                                                Post partum procedures:n/a  Augmentation: none  Complications: None  Hospital course:  Onset of Labor With Vaginal Delivery     33y.o. yo [id number] [id number] admitted in Active Labor on 01/14/2019. Patient had an uncomplicated labor course as follows:  Membrane Rupture Time/Date: 6:00 PM ,01/14/2019   Intrapartum Procedures: Episiotomy: None [1]                                         Lacerations:  None [1]  Patient had a delivery of a Viable infant. 01/14/2019  Information for the patient's newborn:  SpMoni, [id number][[id number]]Delivery Method: Vaginal, Spontaneous(Filed from Delivery Summary)     Pateint had an uncomplicated postpartum course.  She is ambulating, tolerating a regular diet, passing flatus, and urinating well. Patient is discharged home in stable condition on 01/16/19.  Delivery time: 8:37 PM    Magnesium Sulfate received: No BMZ received: No Rhophylac:No MMR:No Transfusion:No  Physical exam  Vitals:   01/15/19 0740 01/15/19 1555 01/15/19 2100 01/16/19 0500  BP: 115/71 117/74 123/75 126/72  Pulse: 71 76 74 72  Resp: 20 20 20 18   Temp: 98.2 F (36.8 C) 97.8 F (36.6 C) 98 F (36.7 C) 97.7 F (36.5 C)  TempSrc: Oral Oral Oral Oral  SpO2: 100% 100% 100% 100%   General: alert, cooperative and no distress Lochia: appropriate Uterine Fundus: firm Incision: N/A DVT Evaluation: No evidence of DVT seen on physical exam. Negative  Homan's sign. No cords or calf tenderness. Labs: Lab Results  Component Value Date   WBC 6.9 01/14/2019   HGB 11.1 (L) 01/14/2019   HCT 34.1 (L) 01/14/2019   MCV 82.6 01/14/2019   PLT 314 01/14/2019   CMP Latest Ref Rng & Units 12/22/2018  Glucose 70 - 99 mg/dL 79  BUN 6 - 20 mg/dL 6  Creatinine 0.44 - 1.00 mg/dL 0.76  Sodium 135 - 145 mmol/L 135  Potassium 3.5 - 5.1 mmol/L 4.5  Chloride 98 - 111 mmol/L 104  CO2 22 - 32 mmol/L 22  Calcium 8.9 - 10.3 mg/dL 8.2(L)  Total Protein 6.5 - 8.1 g/dL 6.4(L)  Total Bilirubin 0.3 - 1.2 mg/dL 0.2(L)  Alkaline Phos 38 - 126 U/L 148(H)  AST 15 - 41 U/L 19  ALT 0 - 44 U/L 11    Discharge instruction: per After Visit Summary and "Baby and Me Booklet".  After visit  meds:  Allergies as of 01/16/2019      Reactions   Macrobid [nitrofurantoin] Hives   Penicillins    States she was young but what she remembers is swelling and fever      Medication List    TAKE these medications   acetaminophen 500 MG tablet Commonly known as: TYLENOL Take 2 tablets (1,000 mg total) by mouth every 8 (eight) hours as needed.   ferrous sulfate 325 (65 FE) MG tablet Take 1 tablet (325 mg total) by mouth every other day.   ibuprofen 600 MG tablet Commonly known as: ADVIL Take 1 tablet (600 mg total) by mouth every 6 (six) hours.       Diet: routine diet  Activity: Advance as tolerated. Pelvic rest for 6 weeks.   Outpatient follow up: Follow up Appt: Future Appointments  Date Time Provider [place]  02/14/2019 10:55 AM Danielle Rankin [facility]   Follow up Visit: [place] [facility]. Schedule an appointment as soon as possible for a visit in 4 day(s).   Specialty: Obstetrics and Gynecology Why: to sign paperwork for and schedule tubal ligation Contact information: [address] [place], [place] [id number] [place] [address] [phone]           Please schedule this patient for PP visit in: 4 weeks Low risk pregnancy complicated by: n/a Delivery mode:  SVD Anticipated Birth Control: depo in hospital, outpt BTL in a month PP Procedures needed: n/a  Schedule Integrated BH visit: no Provider: Any provider  No PNC   Newborn Data: Live born female  Birth Weight:   APGAR: 21, 9  Newborn Delivery   Birth date/time: 01/14/2019 20:37:00 Delivery type: Vaginal, Spontaneous      Baby Feeding: Bottle Disposition:home with mother   Attestation of Attending Supervision of Advanced Practitioner (PA/CNM/NP): Evaluation, management, and procedures were performed by the Advanced Practitioner under my supervision and collaboration.  I have reviewed the Advanced Practitioner's note and chart, and I agree with the management and plan.  Feliz Beam, M.D. Attending [facility] [employer] (Faculty Practice)  01/19/2019 8:45 AM

## 2019-01-14 NOTE — H&P (Signed)
Rachel Stanley is a 33 y.o. female [id number] at [redacted]w[redacted]d presenting for active labor at term.  She has limited prenatal care with last visit May 2020 with 2 MAU visits since that time and an anatomy US with MFM.    Pt started prenatal care at [facility] in [place], [place], at 8 weeks and had care until 22 weeks but the center closed and she did not receive care after May 2020.   Complications in previous pregnancies include a stroke in 2011 pregnancy and was told "blood pressure was high." In the beginning of this pregnancy, she reports using crack cocaine. Continues to occasionally smoke cigarettes and THC. Denies being told of any complications this pregnancy.   OB History    Gravida  6   Para  5   Term  2   Preterm  3   AB      Living  4     SAB      TAB      Ectopic      Multiple      Live Births  5          Past Medical History:  Diagnosis Date  . Medical history non-contributory    Past Surgical History:  Procedure Laterality Date  . NO PAST SURGERIES     Family History: family history includes Heart disease in her father and mother. Social History:  reports that she has been smoking cigarettes. She has quit using smokeless tobacco. She reports previous alcohol use. She reports previous drug use. Drugs: Marijuana and "Crack" cocaine.     Maternal Diabetes: No Genetic Screening: Declined Maternal Ultrasounds/Referrals: Normal Fetal Ultrasounds or other Referrals:  None Maternal Substance Abuse:  Yes:  Type: Cocaine Significant Maternal Medications:  None Significant Maternal Lab Results:  Group B Strep negative Other Comments:  None  Review of Systems  Constitutional: Negative for chills and fever.  Respiratory: Negative for shortness of breath.   Cardiovascular: Negative for chest pain.  Gastrointestinal: Positive for abdominal pain. Negative for constipation, diarrhea and vomiting.  Neurological: Negative for dizziness and headaches.  All other  systems reviewed and are negative.  Maternal Medical History:  Reason for admission: Contractions.   Contractions: Onset was 3-5 hours ago.   Frequency: regular.   Perceived severity is moderate.    Fetal activity: Perceived fetal activity is normal.   Last perceived fetal movement was within the past hour.    Prenatal complications: Limited prenatal care, substance abuse with crack cocaine  Prenatal Complications - Diabetes: none.    Dilation: 5 Effacement (%): 80 Station: -3 Exam by:: Micki Riley CNM There were no vitals taken for this visit. Maternal Exam:  Uterine Assessment: Contraction strength is moderate.  Contraction frequency is regular.   Abdomen: Fetal presentation: vertex  Cervix: Cervix evaluated by digital exam.     Fetal Exam Fetal Monitor Review: Mode: ultrasound.   Baseline rate: 135.  Variability: moderate (6-25 bpm).   Pattern: accelerations present and no decelerations.    Fetal State Assessment: Category I - tracings are normal.     Physical Exam  Nursing note and vitals reviewed. Constitutional: She is oriented to person, place, and time. She appears well-developed and well-nourished.  Neck: Normal range of motion.  Cardiovascular: Normal rate, regular rhythm and normal heart sounds.  Respiratory: Effort normal and breath sounds normal.  GI: Soft.  Musculoskeletal: Normal range of motion.  Neurological: She is alert and oriented to person, place, and time.  She has normal reflexes.  Skin: Skin is warm and dry.  Psychiatric: She has a normal mood and affect. Her behavior is normal. Judgment and thought content normal.    Prenatal labs: ABO, Rh: --/--/A POS (08/25 1335) Antibody: NEG (08/25 1335) Rubella: 6.64 (08/25 1332) RPR: NON REACTIVE (08/25 1332)  HBsAg: Negative (08/25 1332)  HIV: NON REACTIVE, Non Reactive (08/25 1332)  GBS:   Negative 12/19/18  Assessment/Plan: [id number] at 558w4d admitted for SOL GBS negative Limited  prenatal care  Admit to L&D UDS pending Expectant management Anticipate NSVD    Sharen CounterLisa Leftwich-Kirby 01/14/2019, 7:08 PM

## 2019-01-15 LAB — RPR: RPR Ser Ql: NONREACTIVE

## 2019-01-15 MED ORDER — INFLUENZA VAC SPLIT QUAD 0.5 ML IM SUSY
0.5000 mL | PREFILLED_SYRINGE | INTRAMUSCULAR | Status: AC
  Administered 2019-01-16: 0.5 mL via INTRAMUSCULAR
  Filled 2019-01-15: qty 0.5

## 2019-01-15 MED ORDER — MEDROXYPROGESTERONE ACETATE 150 MG/ML IM SUSP
150.0000 mg | Freq: Once | INTRAMUSCULAR | Status: AC
  Administered 2019-01-16: 150 mg via INTRAMUSCULAR
  Filled 2019-01-15: qty 1

## 2019-01-15 NOTE — Discharge Instructions (Signed)

## 2019-01-15 NOTE — Progress Notes (Addendum)
Post Partum Day 1 Subjective: Patient up and walking around. Social worker present. Patient would like BTL done in a few weeks. She is bottle feeding. Minimal bleeding. Pain well controlled.  Objective: Blood pressure 115/71, pulse 71, temperature 98.2 F (36.8 C), temperature source Oral, resp. rate 20, SpO2 100 %, unknown if currently breastfeeding.  Physical Exam:  General: alert, cooperative, appears stated age and no distress Lochia: appropriate Uterine Fundus: firm DVT Evaluation: No evidence of DVT seen on physical exam.  Recent Labs    01/14/19 1915  HGB 11.1*  HCT 34.1*    Assessment/Plan: Plan for discharge tomorrow Social Work consult; pending recs Bottle feeding  BTL pre-op assessment/appt for consent requested at [facility]; Depo prior to [place] stable   LOS: 1 day   [employer] 01/15/2019, 1:45 PM
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
## 2019-01-15 NOTE — Progress Notes (Signed)
CSW made [place] CPS report with Eric Chin due to [name]'s substance use during pregnancy and due to [name] not having custody of her other children. CSW to await CPS disposition.  Ashanna Heinsohn, LCSWA  [facility] [phone]  

## 2019-01-16 MED ORDER — IBUPROFEN 600 MG PO TABS: 600.0000 mg | ORAL_TABLET | Freq: Four times a day (QID) | ORAL | 0 refills | Status: DC

## 2019-01-16 NOTE — Progress Notes (Signed)
CSW informed by [name] with [facility] CPS that case has been assigned to [facility] as a 72 hour response. CSW has attempted to reach out to [facility] and has left voicemail for return call.  Rachel Stanley, [place]  [facility] [employer] [phone]

## 2019-01-22 ENCOUNTER — Encounter: Payer: Medicaid Other | Admitting: Nurse Practitioner

## 2019-02-07 ENCOUNTER — Ambulatory Visit: Payer: Medicaid Other | Admitting: Obstetrics and Gynecology

## 2019-02-14 ENCOUNTER — Ambulatory Visit: Payer: Medicaid Other | Admitting: Medical

## 2019-03-12 ENCOUNTER — Other Ambulatory Visit: Payer: Self-pay

## 2019-03-12 ENCOUNTER — Emergency Department (HOSPITAL_COMMUNITY)
Admission: EM | Admit: 2019-03-12 | Discharge: 2019-03-12 | Disposition: A | Discharge status: Home / Self Care | Payer: Medicaid Other | Attending: Emergency Medicine | Admitting: Emergency Medicine

## 2019-03-12 DIAGNOSIS — Z20828 Contact with and (suspected) exposure to other viral communicable diseases: Secondary | ICD-10-CM | POA: Diagnosis not present

## 2019-03-12 DIAGNOSIS — R112 Nausea with vomiting, unspecified: Secondary | ICD-10-CM | POA: Insufficient documentation

## 2019-03-12 DIAGNOSIS — R197 Diarrhea, unspecified: Secondary | ICD-10-CM | POA: Insufficient documentation

## 2019-03-12 DIAGNOSIS — F17210 Nicotine dependence, cigarettes, uncomplicated: Secondary | ICD-10-CM | POA: Insufficient documentation

## 2019-03-12 DIAGNOSIS — R1084 Generalized abdominal pain: Secondary | ICD-10-CM | POA: Insufficient documentation

## 2019-03-12 DIAGNOSIS — Z79899 Other long term (current) drug therapy: Secondary | ICD-10-CM | POA: Diagnosis not present

## 2019-03-12 LAB — COMPREHENSIVE METABOLIC PANEL
ALT: 17 U/L (ref 0–44)
AST: 17 U/L (ref 15–41)
Albumin: 3.7 g/dL (ref 3.5–5.0)
Alkaline Phosphatase: 50 U/L (ref 38–126)
Anion gap: 10 (ref 5–15)
BUN: 8 mg/dL (ref 6–20)
CO2: 25 mmol/L (ref 22–32)
Calcium: 9.1 mg/dL (ref 8.9–10.3)
Chloride: 104 mmol/L (ref 98–111)
Creatinine, Ser: 0.91 mg/dL (ref 0.44–1.00)
GFR calc Af Amer: >60 mL/min (ref >60)
GFR calc non Af Amer: >60 mL/min (ref >60)
Glucose, Bld: 92 mg/dL (ref 70–99)
Potassium: 4.1 mmol/L (ref 3.5–5.1)
Sodium: 139 mmol/L (ref 135–145)
Total Bilirubin: 0.3 mg/dL (ref 0.3–1.2)
Total Protein: 7 g/dL (ref 6.5–8.1)

## 2019-03-12 LAB — CBC WITH DIFFERENTIAL/PLATELET
Abs Immature Granulocytes: 0.02 10*3/uL (ref 0.00–0.07)
Basophils Absolute: 0 10*3/uL (ref 0.0–0.1)
Basophils Relative: 0 %
Eosinophils Absolute: 0.1 10*3/uL (ref 0.0–0.5)
Eosinophils Relative: 1 %
HCT: 38 % (ref 36.0–46.0)
Hemoglobin: 12 g/dL (ref 12.0–15.0)
Immature Granulocytes: 0 %
Lymphocytes Relative: 33 %
Lymphs Abs: 2.3 10*3/uL (ref 0.7–4.0)
MCH: 26 pg (ref 26.0–34.0)
MCHC: 31.6 g/dL (ref 30.0–36.0)
MCV: 82.4 fL (ref 80.0–100.0)
Monocytes Absolute: 0.5 10*3/uL (ref 0.1–1.0)
Monocytes Relative: 7 %
Neutro Abs: 4.1 10*3/uL (ref 1.7–7.7)
Neutrophils Relative %: 59 %
Platelets: 357 10*3/uL (ref 150–400)
RBC: 4.61 MIL/uL (ref 3.87–5.11)
RDW: 19.3 % — ABNORMAL HIGH (ref 11.5–15.5)
WBC: 7 10*3/uL (ref 4.0–10.5)
nRBC: 0 % (ref 0.0–0.2)

## 2019-03-12 LAB — URINALYSIS, ROUTINE W REFLEX MICROSCOPIC
Bilirubin Urine: NEGATIVE
Glucose, UA: NEGATIVE mg/dL
Hgb urine dipstick: NEGATIVE
Ketones, ur: NEGATIVE mg/dL
Leukocytes,Ua: NEGATIVE
Nitrite: NEGATIVE
Protein, ur: NEGATIVE mg/dL
Specific Gravity, Urine: 1.012 (ref 1.005–1.030)
pH: 7 (ref 5.0–8.0)

## 2019-03-12 LAB — SARS CORONAVIRUS 2 (TAT 6-24 HRS): SARS Coronavirus 2: NEGATIVE

## 2019-03-12 LAB — I-STAT BETA HCG BLOOD, ED (MC, WL, AP ONLY): I-stat hCG, quantitative: <5 m[IU]/mL (ref <5)

## 2019-03-12 LAB — LIPASE, BLOOD: Lipase: 26 U/L (ref 11–51)

## 2019-03-12 NOTE — ED Triage Notes (Signed)
Pt endorses mid abd pain since yesterday with n/v/d yesterday, feels better today. Needs a dr's note to go back to work. VSS.

## 2019-03-12 NOTE — Discharge Instructions (Signed)
Your work-up today was reassuring.  You have been tested for Covid.  You should quarantine yourself until the results come back.  Return emergency department for fever, vomiting, worsening pain, difficulty breathing.     Person Under Monitoring Name: Rachel Stanley  Location: [address] [place] [address]   Infection Prevention Recommendations for Individuals Confirmed to have, or Being Evaluated for, 2019 Novel Coronavirus (COVID-19) Infection Who Receive Care at Home  Individuals who are confirmed to have, or are being evaluated for, COVID-19 should follow the prevention steps below until a healthcare provider or local or state health department says they can return to normal activities.  Stay home except to get medical care You should restrict activities outside your home, except for getting medical care. Do not go to work, school, or public areas, and do not use public transportation or taxis.  Call ahead before visiting your doctor Before your medical appointment, call the healthcare provider and tell them that you have, or are being evaluated for, COVID-19 infection. This will help the healthcare providers office take steps to keep other people from getting infected. Ask your healthcare provider to call the local or state health department.  Monitor your symptoms Seek prompt medical attention if your illness is worsening (e.g., difficulty breathing). Before going to your medical appointment, call the healthcare provider and tell them that you have, or are being evaluated for, COVID-19 infection. Ask your healthcare provider to call the local or state health department.  Wear a facemask You should wear a facemask that covers your nose and mouth when you are in the same room with other people and when you visit a healthcare provider. People who live with or visit you should also wear a facemask while they are in the same room with you.  Separate yourself from  other people in your home As much as possible, you should stay in a different room from other people in your home. Also, you should use a separate bathroom, if available.  Avoid sharing household items You should not share dishes, drinking glasses, cups, eating utensils, towels, bedding, or other items with other people in your home. After using these items, you should wash them thoroughly with soap and water.  Cover your coughs and sneezes Cover your mouth and nose with a tissue when you cough or sneeze, or you can cough or sneeze into your sleeve. Throw used tissues in a lined trash can, and immediately wash your hands with soap and water for at least 20 seconds or use an alcohol-based hand rub.  Wash your [employer] your hands often and thoroughly with soap and water for at least 20 seconds. You can use an alcohol-based hand sanitizer if soap and water are not available and if your hands are not visibly dirty. Avoid touching your eyes, nose, and mouth with unwashed hands.   Prevention Steps for Caregivers and Household Members of Individuals Confirmed to have, or Being Evaluated for, COVID-19 Infection Being Cared for in the Home  If you live with, or provide care at home for, a person confirmed to have, or being evaluated for, COVID-19 infection please follow these guidelines to prevent infection:  Follow healthcare providers instructions Make sure that you understand and can help the patient follow any healthcare provider instructions for all care.  Provide for the patients basic needs You should help the patient with basic needs in the home and provide support for getting groceries, prescriptions, and other personal needs.  Monitor  the patients symptoms If they are getting sicker, call his or her medical provider and tell them that the patient has, or is being evaluated for, COVID-19 infection. This will help the healthcare providers office take steps to keep other people  from getting infected. Ask the healthcare provider to call the local or state health department.  Limit the number of people who have contact with the patient If possible, have only one caregiver for the patient. Other household members should stay in another home or place of residence. If this is not possible, they should stay in another room, or be separated from the patient as much as possible. Use a separate bathroom, if available. Restrict visitors who do not have an essential need to be in the home.  Keep older adults, very young children, and other sick people away from the patient Keep older adults, very young children, and those who have compromised immune systems or chronic health conditions away from the patient. This includes people with chronic heart, lung, or kidney conditions, diabetes, and cancer.  Ensure good ventilation Make sure that shared spaces in the home have good air flow, such as from an air conditioner or an opened window, weather permitting.  Wash your hands often Wash your hands often and thoroughly with soap and water for at least 20 seconds. You can use an alcohol based hand sanitizer if soap and water are not available and if your hands are not visibly dirty. Avoid touching your eyes, nose, and mouth with unwashed hands. Use disposable paper towels to dry your hands. If not available, use dedicated cloth towels and replace them when they become wet.  Wear a facemask and gloves Wear a disposable facemask at all times in the room and gloves when you touch or have contact with the patients blood, body fluids, and/or secretions or excretions, such as sweat, saliva, sputum, nasal mucus, vomit, urine, or feces.  Ensure the mask fits over your nose and mouth tightly, and do not touch it during use. Throw out disposable facemasks and gloves after using them. Do not reuse. Wash your hands immediately after removing your facemask and gloves. If your personal clothing  becomes contaminated, carefully remove clothing and launder. Wash your hands after handling contaminated clothing. Place all used disposable facemasks, gloves, and other waste in a lined container before disposing them with other household waste. Remove gloves and wash your hands immediately after handling these items.  Do not share dishes, glasses, or other household items with the patient Avoid sharing household items. You should not share dishes, drinking glasses, cups, eating utensils, towels, bedding, or other items with a patient who is confirmed to have, or being evaluated for, COVID-19 infection. After the person uses these items, you should wash them thoroughly with soap and water.  Wash laundry thoroughly Immediately remove and wash clothes or bedding that have blood, body fluids, and/or secretions or excretions, such as sweat, saliva, sputum, nasal mucus, vomit, urine, or feces, on them. Wear gloves when handling laundry from the patient. Read and follow directions on labels of laundry or clothing items and detergent. In general, wash and dry with the warmest temperatures recommended on the label.  Clean all areas the individual has used often Clean all touchable surfaces, such as counters, tabletops, doorknobs, bathroom fixtures, toilets, phones, keyboards, tablets, and bedside tables, every day. Also, clean any surfaces that may have blood, body fluids, and/or secretions or excretions on them. Wear gloves when cleaning surfaces the patient has come  in contact with. Use a diluted bleach solution (e.g., dilute bleach with 1 part bleach and 10 parts water) or a household disinfectant with a label that says EPA-registered for coronaviruses. To make a bleach solution at home, add 1 tablespoon of bleach to 1 quart (4 cups) of water. For a larger supply, add  cup of bleach to 1 gallon (16 cups) of water. Read labels of cleaning products and follow recommendations provided on product labels.  Labels contain instructions for safe and effective use of the cleaning product including precautions you should take when applying the product, such as wearing gloves or eye protection and making sure you have good ventilation during use of the product. Remove gloves and wash hands immediately after cleaning.  Monitor yourself for signs and symptoms of illness Caregivers and household members are considered close contacts, should monitor their health, and will be asked to limit movement outside of the home to the extent possible. Follow the monitoring steps for close contacts listed on the symptom monitoring form.   ? If you have additional questions, contact your local health department or call the epidemiologist on call at [phone] (available 24/7). ? This guidance is subject to change. For the most up-to-date guidance from [facility], please refer to their website: TripMetro.huhttps://www.cdc.gov/coronavirus/2019-ncov/hcp/guidance-prevent-spread.html

## 2019-03-12 NOTE — ED Provider Notes (Signed)
[facility] EMERGENCY DEPARTMENT Provider Note   CSN: [id number] Arrival date & time: 03/12/19  0865     History   Chief Complaint Chief Complaint  Patient presents with   Abdominal Pain    HPI Rachel Stanley is a 33 y.o. female who presents for evaluation of abdominal pain, nausea/vomiting and diarrhea that began yesterday.  Patient reports she started developing some periumbilical and epigastric abdominal pain yesterday.  She describes it as "a stomachache."  She reports multiple episodes of nonbloody, nonbilious vomiting yesterday.  She states she was having trouble tolerating any p.o.  Reports a couple episodes of diarrhea states that it was nonbloody.  She states that today, vomiting and diarrhea have improved but she is still having some mild abdominal pain.  She does report that it is better than yesterday.  She states she denies any fevers, chest pain or difficulty breathing.  She is not had any urinary complaints, vaginal bleeding, vaginal discharge.  Patient states she has been around several sick contacts at work but no known COVID-19 exposure.  No travel.     The history is provided by the patient.    Past Medical History:  Diagnosis Date   Medical history non-contributory     Patient Active Problem List   Diagnosis Date Noted   Indication for care in labor or delivery 01/14/2019    Past Surgical History:  Procedure Laterality Date   NO PAST SURGERIES       OB History    Gravida  6   Para  6   Term  3   Preterm  3   AB      Living  5     SAB      TAB      Ectopic      Multiple  0   Live Births  6            Home Medications    Prior to Admission medications   Medication Sig Start Date End Date Taking? Authorizing Provider  acetaminophen (TYLENOL) 500 MG tablet Take 2 tablets (1,000 mg total) by mouth every 8 (eight) hours as needed. 12/19/18   FairHoyle Sauer, MD  ferrous sulfate 325 (65 FE) MG tablet Take 1  tablet (325 mg total) by mouth every other day. 12/19/18 12/19/19  Joselyn Arrow, MD  ibuprofen (ADVIL) 600 MG tablet Take 1 tablet (600 mg total) by mouth every 6 (six) hours. 01/16/19   Cresenzo-Dishmon, Scarlette Calico, CNM    Family History Family History  Problem Relation Age of Onset   Heart disease Mother    Heart disease Father     Social History Social History   Tobacco Use   Smoking status: Current Some Day Smoker    Types: Cigarettes   Smokeless tobacco: Former Neurosurgeon  Substance Use Topics   Alcohol use: Not Currently   Drug use: Not Currently    Types: Marijuana, "Crack" cocaine    Comment: clast crack/cocaine was 3.5-4 months, marijuanna occasionally,,     Allergies   Macrobid [nitrofurantoin] and Penicillins   Review of Systems Review of Systems  Constitutional: Negative for fever.  Respiratory: Negative for cough and shortness of breath.   Cardiovascular: Negative for chest pain.  Gastrointestinal: Positive for abdominal pain, diarrhea, nausea and vomiting. Negative for blood in stool.  Genitourinary: Negative for dysuria and hematuria.  Neurological: Negative for headaches.  All other systems reviewed and are negative.    Physical Exam  Updated Vital Signs BP 132/84 (BP Location: Left Arm)    Pulse 83    Temp 98.2 F (36.8 C) (Oral)    Resp 16    SpO2 100%    Breastfeeding No   Physical Exam Vitals signs and nursing note reviewed.  Constitutional:      Appearance: Normal appearance. She is well-developed.  HENT:     Head: Normocephalic and atraumatic.  Eyes:     General: Lids are normal.     Conjunctiva/sclera: Conjunctivae normal.     Pupils: Pupils are equal, round, and reactive to light.  Neck:     Musculoskeletal: Full passive range of motion without pain.  Cardiovascular:     Rate and Rhythm: Normal rate and regular rhythm.     Pulses: Normal pulses.     Heart sounds: Normal heart sounds. No murmur. No friction rub. No gallop.   Pulmonary:      Effort: Pulmonary effort is normal.  Abdominal:     Palpations: Abdomen is soft. Abdomen is not rigid.     Tenderness: There is abdominal tenderness in the epigastric area and periumbilical area. There is no guarding.     Comments: Mild tenderness noted periumbilical and epigastric region.  No rigidity, guarding.  No focal tenderness noted to lower abdomen.  No CVA tenderness noted bilaterally.  Musculoskeletal: Normal range of motion.  Skin:    General: Skin is warm and dry.     Capillary Refill: Capillary refill takes less than 2 seconds.  Neurological:     Mental Status: She is alert and oriented to person, place, and time.  Psychiatric:        Speech: Speech normal.      ED Treatments / Results  Labs (all labs ordered are listed, but only abnormal results are displayed) Labs Reviewed  CBC WITH DIFFERENTIAL/PLATELET - Abnormal; Notable for the following components:      Result Value   RDW 19.3 (*)    All other components within normal limits  URINALYSIS, ROUTINE W REFLEX MICROSCOPIC - Abnormal; Notable for the following components:   Color, Urine STRAW (*)    All other components within normal limits  SARS CORONAVIRUS 2 (TAT 6-24 HRS)  COMPREHENSIVE METABOLIC PANEL  LIPASE, BLOOD  I-STAT BETA HCG BLOOD, ED ([facility], [facility], [facility] ONLY)    EKG None  Radiology No results found.  Procedures Procedures (including critical care time)  Medications Ordered in ED Medications - No data to display   Initial Impression / Assessment and Plan / ED Course  I have reviewed the triage vital signs and the nursing notes.  Pertinent labs & imaging results that were available during my care of the patient were reviewed by me and considered in my medical decision making (see chart for details).        34 year old female who presents for evaluation of abdominal pain, nausea/vomiting/diarrhea that began yesterday.  States symptoms have improved but she is still having some abdominal  pain.  No fevers.  No chest pain. Patient is afebrile, non-toxic appearing, sitting comfortably on examination table. Vital signs reviewed and stable.  On exam, she has some mild tenderness noted to the mid abdomen region.  Consider viral GI process versus foodborne pathogen illness versus viral process.  Plan check basic labs.  CMP shows no acute abnormalities.  CBC without any significant leukocytosis or anemia.  Lipase is unremarkable.  UA negative for any infectious etiology.  I-STAT beta is negative (due to difficulty with labs  crossing over into epic, this is recorded in her media file).   Reevaluation.  Patient is resting comfortably.  She is hemodynamically stable.  She has tolerated p.o. without any difficulty and reports feeling better.  She does report that she is concerned about being exposed to sick coworkers.  We will do outpatient Covid test.  Instructed her to quarantine until Covid test results come back.  At this time, abdomen exam is benign.  Do not feel that she has a surgical abdomen and does not feel that CT abdomen pelvis is warranted in this case. At this time, patient exhibits no emergent life-threatening condition that require further evaluation in ED or admission. Patient had ample opportunity for questions and discussion. All patient's questions were answered with full understanding. Strict return precautions discussed. Patient expresses understanding and agreement to plan.   Portions of this note were generated with Scientist, clinical (histocompatibility and immunogenetics)Dragon dictation software. Dictation errors may occur despite best attempts at proofreading.   Final Clinical Impressions(s) / ED Diagnoses   Final diagnoses:  Generalized abdominal pain    ED Discharge Orders    None       Maxwell CaulLayden, Dacotah Cabello A, PA-C 03/12/19 1314    Pricilla LovelessGoldston, Scott, MD 03/12/19 1556

## 2019-03-12 NOTE — ED Notes (Signed)
Patient was given a [place] Sandwich bag, [employer], and a Sprite w/ an cup of Ice.

## 2019-04-04 DIAGNOSIS — F310 Bipolar disorder, current episode hypomanic: Secondary | ICD-10-CM | POA: Diagnosis not present

## 2019-04-05 ENCOUNTER — Other Ambulatory Visit: Payer: Self-pay

## 2019-04-05 ENCOUNTER — Ambulatory Visit (INDEPENDENT_AMBULATORY_CARE_PROVIDER_SITE_OTHER): Payer: Medicaid Other | Admitting: Obstetrics & Gynecology

## 2019-04-05 VITALS — Wt 177.0 lb

## 2019-04-05 DIAGNOSIS — Z3042 Encounter for surveillance of injectable contraceptive: Secondary | ICD-10-CM | POA: Diagnosis not present

## 2019-04-05 MED ORDER — MEDROXYPROGESTERONE ACETATE 150 MG/ML IM SUSP
150.0000 mg | Freq: Once | INTRAMUSCULAR | Status: AC
  Administered 2019-04-05: 150 mg via INTRAMUSCULAR

## 2019-04-13 ENCOUNTER — Encounter: Payer: Self-pay | Admitting: Obstetrics & Gynecology

## 2019-04-13 NOTE — Progress Notes (Signed)
Patient received her Depo Provera injection by RN in office, please see MAR for details.  Verita Schneiders, MD

## 2019-06-12 ENCOUNTER — Encounter (HOSPITAL_COMMUNITY): Payer: Self-pay

## 2019-06-12 ENCOUNTER — Ambulatory Visit (HOSPITAL_COMMUNITY)
Admission: EM | Admit: 2019-06-12 | Discharge: 2019-06-12 | Disposition: A | Discharge status: Home / Self Care | Payer: Medicaid Other | Attending: Family Medicine | Admitting: Family Medicine

## 2019-06-12 ENCOUNTER — Other Ambulatory Visit: Payer: Self-pay

## 2019-06-12 DIAGNOSIS — J069 Acute upper respiratory infection, unspecified: Secondary | ICD-10-CM | POA: Diagnosis not present

## 2019-06-12 DIAGNOSIS — J4521 Mild intermittent asthma with (acute) exacerbation: Secondary | ICD-10-CM | POA: Diagnosis not present

## 2019-06-12 DIAGNOSIS — B9789 Other viral agents as the cause of diseases classified elsewhere: Secondary | ICD-10-CM | POA: Diagnosis not present

## 2019-06-12 DIAGNOSIS — R05 Cough: Secondary | ICD-10-CM | POA: Diagnosis not present

## 2019-06-12 DIAGNOSIS — J4520 Mild intermittent asthma, uncomplicated: Secondary | ICD-10-CM | POA: Diagnosis not present

## 2019-06-12 MED ORDER — PREDNISONE 50 MG PO TABS: 50.0000 mg | ORAL_TABLET | Freq: Every day | ORAL | 0 refills | Status: AC

## 2019-06-12 MED ORDER — FLUTICASONE PROPIONATE 50 MCG/ACT NA SUSP: 1.0000 | Freq: Every day | NASAL | 0 refills | Status: DC

## 2019-06-12 MED ORDER — ALBUTEROL SULFATE HFA 108 (90 BASE) MCG/ACT IN AERS: 1.0000 | INHALATION_SPRAY | Freq: Four times a day (QID) | RESPIRATORY_TRACT | 0 refills | Status: DC | PRN

## 2019-06-12 MED ORDER — BENZONATATE 200 MG PO CAPS: 200.0000 mg | ORAL_CAPSULE | Freq: Three times a day (TID) | ORAL | 0 refills | Status: AC | PRN

## 2019-06-12 MED ORDER — ALBUTEROL SULFATE (2.5 MG/3ML) 0.083% IN NEBU: 2.5000 mg | INHALATION_SOLUTION | Freq: Four times a day (QID) | RESPIRATORY_TRACT | 0 refills | Status: DC | PRN

## 2019-06-12 MED ORDER — DOXYCYCLINE HYCLATE 100 MG PO CAPS: 100.0000 mg | ORAL_CAPSULE | Freq: Two times a day (BID) | ORAL | 0 refills | Status: AC

## 2019-06-12 NOTE — ED Triage Notes (Signed)
Pt state she has been coughing up mucus for a week now. Pt state she has had a Covid tested and it was negative 6 days ago.

## 2019-06-12 NOTE — Discharge Instructions (Signed)
I have refilled your albuterol inhaler, we are giving you a new breathing machine to use nebulizers at home Begin prednisone daily for 5 days to help with wheezing/shortness of breath, take with food Begin doxycycline twice daily for the next week Tessalon every 8 hours for cough May continue Mucinex, add in [place] for nasal congestion  Please continue to monitor symptoms, follow-up if not improving or worsening

## 2019-06-13 NOTE — ED Provider Notes (Signed)
[place]    CSN: [id number] Arrival date & time: 06/12/19  1037      History   Chief Complaint Chief Complaint  Patient presents with  . Cough    HPI Rachel Stanley is a 34 y.o. female history of asthma presenting today for evaluation of a cough.  Patient states that over the past week she has had a cough with a lot of mucus production.  States that the cough has been harsh and has caused her chest to hurt.  Feels very sore.  Symptoms began last Tuesday and she had a Covid test around this time that was negative.  She has been using Mucinex and Robitussin without relief.  She denies any fevers.  Denies any shortness of breath, does report some wheezing.  Has some nebulizers at home, but lost her nebulizer machine.  Denies GI symptoms.  HPI  Past Medical History:  Diagnosis Date  . Medical history non-contributory     Patient Active Problem List   Diagnosis Date Noted  . Indication for care in labor or delivery 01/14/2019    Past Surgical History:  Procedure Laterality Date  . NO PAST SURGERIES      OB History    Gravida  6   Para  6   Term  3   Preterm  3   AB      Living  5     SAB      TAB      Ectopic      Multiple  0   Live Births  6            Home Medications    Prior to Admission medications   Medication Sig Start Date End Date Taking? Authorizing Provider  acetaminophen (TYLENOL) 500 MG tablet Take 2 tablets (1,000 mg total) by mouth every 8 (eight) hours as needed. 12/19/18   Fair, Marin Shutter, MD  albuterol (PROVENTIL) (2.5 MG/3ML) 0.083% nebulizer solution Take 3 mLs (2.5 mg total) by nebulization every 6 (six) hours as needed for wheezing or shortness of breath. 06/12/19   Justus Duerr C, PA-C  albuterol (VENTOLIN HFA) 108 (90 Base) MCG/ACT inhaler Inhale 1-2 puffs into the lungs every 6 (six) hours as needed for wheezing or shortness of breath. 06/12/19   Tessie Ordaz C, PA-C  benzonatate (TESSALON) 200 MG capsule  Take 1 capsule (200 mg total) by mouth 3 (three) times daily as needed for up to 7 days for cough. 06/12/19 06/19/19  Latanza Pfefferkorn C, PA-C  doxycycline (VIBRAMYCIN) 100 MG capsule Take 1 capsule (100 mg total) by mouth 2 (two) times daily for 10 days. 06/12/19 06/22/19  Oma Marzan C, PA-C  ferrous sulfate 325 (65 FE) MG tablet Take 1 tablet (325 mg total) by mouth every other day. 12/19/18 12/19/19  FairMarin Shutter, MD  fluticasone (FLONASE) 50 MCG/ACT nasal spray Place 1-2 sprays into both nostrils daily for 7 days. 06/12/19 06/19/19  Aldrin Engelhard C, PA-C  ibuprofen (ADVIL) 600 MG tablet Take 1 tablet (600 mg total) by mouth every 6 (six) hours. 01/16/19   Cresenzo-Dishmon, Joaquim Lai, CNM  predniSONE (DELTASONE) 50 MG tablet Take 1 tablet (50 mg total) by mouth daily for 5 days. 06/12/19 06/17/19  Malita Ignasiak, Elesa Hacker, PA-C    Family History Family History  Problem Relation Age of Onset  . Heart disease Mother   . Heart disease Father     Social History Social History   Tobacco Use  .  Smoking status: Current Some Day Smoker    Types: Cigarettes  . Smokeless tobacco: Former Engineer, water Use Topics  . Alcohol use: Not Currently  . Drug use: Not Currently    Types: Marijuana, "Crack" cocaine    Comment: clast crack/cocaine was 3.5-4 months, marijuanna occasionally,,     Allergies   Macrobid [nitrofurantoin] and Penicillins   Review of Systems Review of Systems  Constitutional: Negative for activity change, appetite change, chills, fatigue and fever.  HENT: Positive for congestion and rhinorrhea. Negative for ear pain, sinus pressure, sore throat and trouble swallowing.   Eyes: Negative for discharge and redness.  Respiratory: Positive for cough and wheezing. Negative for chest tightness and shortness of breath.   Cardiovascular: Positive for chest pain.  Gastrointestinal: Negative for abdominal pain, diarrhea, nausea and vomiting.  Musculoskeletal: Positive for myalgias.  Skin:  Negative for rash.  Neurological: Negative for dizziness, light-headedness and headaches.     Physical Exam Triage Vital Signs ED Triage Vitals  Enc Vitals Group     BP 06/12/19 1144 (!) 151/66     Pulse Rate 06/12/19 1144 78     Resp 06/12/19 1144 18     Temp 06/12/19 1144 98.6 F (37 C)     Temp Source 06/12/19 1144 Oral     SpO2 06/12/19 1144 100 %     Weight 06/12/19 1142 208 lb (94.3 kg)     Height --      Head Circumference --      Peak Flow --      Pain Score 06/12/19 1142 6     Pain Loc --      Pain Edu? --      Excl. in GC? --    No data found.  Updated Vital Signs BP (!) 151/66 (BP Location: Right Arm)   Pulse 78   Temp 98.6 F (37 C) (Oral)   Resp 18   Wt 208 lb (94.3 kg)   SpO2 100%   BMI 35.70 kg/m   Visual Acuity Right Eye Distance:   Left Eye Distance:   Bilateral Distance:    Right Eye Near:   Left Eye Near:    Bilateral Near:     Physical Exam Vitals and nursing note reviewed.  Constitutional:      General: She is not in acute distress.    Appearance: She is well-developed.  HENT:     Head: Normocephalic and atraumatic.     Ears:     Comments: Bilateral ears without tenderness to palpation of external auricle, tragus and mastoid, EAC's without erythema or swelling, TM's with good bony landmarks and cone of light. Non erythematous.     Mouth/Throat:     Comments: Oral mucosa pink and moist, no tonsillar enlargement or exudate. Posterior pharynx patent and nonerythematous, no uvula deviation or swelling. Normal phonation. Eyes:     Conjunctiva/sclera: Conjunctivae normal.  Cardiovascular:     Rate and Rhythm: Normal rate and regular rhythm.     Heart sounds: No murmur.  Pulmonary:     Effort: Pulmonary effort is normal. No respiratory distress.     Breath sounds: Normal breath sounds.     Comments: Breathing comfortably at rest, CTABL, no wheezing, rales or other adventitious sounds auscultated  Abdominal:     Palpations: Abdomen is  soft.     Tenderness: There is no abdominal tenderness.  Musculoskeletal:     Cervical back: Neck supple.     Comments: Anterior chest and  bilateral periscapular areas tender to palpation  Skin:    General: Skin is warm and dry.  Neurological:     Mental Status: She is alert.      UC Treatments / Results  Labs (all labs ordered are listed, but only abnormal results are displayed) Labs Reviewed - No data to display  EKG   Radiology No results found.  Procedures Procedures (including critical care time)  Medications Ordered in UC Medications - No data to display  Initial Impression / Assessment and Plan / UC Course  I have reviewed the triage vital signs and the nursing notes.  Pertinent labs & imaging results that were available during my care of the patient were reviewed by me and considered in my medical decision making (see chart for details).     Exam unremarkable today, vital signs stable without tachycardia fever or hypoxia.  Given reported wheezing and history of asthma will initiate on course of prednisone, refilling albuterol inhaler and providing albuterol nebs along with nebulizer machine today to use at home.  We will go ahead and cover for atypicals with doxycycline, Flonase and Tessalon.  Continue to monitor symptoms, chest discomfort most likely chest wall pain, do not suspect cardiology etiology at this time.  Discussed strict return precautions. Patient verbalized understanding and is agreeable with plan.  Final Clinical Impressions(s) / UC Diagnoses   Final diagnoses:  Viral URI with cough  Mild intermittent asthma with exacerbation     Discharge Instructions     I have refilled your albuterol inhaler, we are giving you a new breathing machine to use nebulizers at home Begin prednisone daily for 5 days to help with wheezing/shortness of breath, take with food Begin doxycycline twice daily for the next week Tessalon every 8 hours for cough May  continue Mucinex, add in Flonase for nasal congestion  Please continue to monitor symptoms, follow-up if not improving or worsening   ED Prescriptions    Medication Sig Dispense Auth. Provider   albuterol (VENTOLIN HFA) 108 (90 Base) MCG/ACT inhaler Inhale 1-2 puffs into the lungs every 6 (six) hours as needed for wheezing or shortness of breath. 8 g Aydian Dimmick C, PA-C   albuterol (PROVENTIL) (2.5 MG/3ML) 0.083% nebulizer solution Take 3 mLs (2.5 mg total) by nebulization every 6 (six) hours as needed for wheezing or shortness of breath. 75 mL Millena Callins C, PA-C   predniSONE (DELTASONE) 50 MG tablet Take 1 tablet (50 mg total) by mouth daily for 5 days. 5 tablet Tatisha Cerino C, PA-C   benzonatate (TESSALON) 200 MG capsule Take 1 capsule (200 mg total) by mouth 3 (three) times daily as needed for up to 7 days for cough. 28 capsule Katia Hannen C, PA-C   doxycycline (VIBRAMYCIN) 100 MG capsule Take 1 capsule (100 mg total) by mouth 2 (two) times daily for 10 days. 20 capsule Regene Mccarthy C, PA-C   fluticasone (FLONASE) 50 MCG/ACT nasal spray Place 1-2 sprays into both nostrils daily for 7 days. 1 g Malai Lady, [place] C, PA-C     PDMP not reviewed this encounter.   Lew Dawes, [place] 06/13/19 [phone]

## 2019-06-21 ENCOUNTER — Ambulatory Visit (INDEPENDENT_AMBULATORY_CARE_PROVIDER_SITE_OTHER): Payer: Medicaid Other

## 2019-06-21 ENCOUNTER — Other Ambulatory Visit: Payer: Self-pay

## 2019-06-21 VITALS — BP 115/71 | HR 69 | Wt 193.9 lb

## 2019-06-21 DIAGNOSIS — Z3042 Encounter for surveillance of injectable contraceptive: Secondary | ICD-10-CM

## 2019-06-21 MED ORDER — MEDROXYPROGESTERONE ACETATE 150 MG/ML IM SUSP
150.0000 mg | Freq: Once | INTRAMUSCULAR | Status: AC
  Administered 2019-06-21: 10:00:00 150 mg via INTRAMUSCULAR

## 2019-06-21 NOTE — Progress Notes (Signed)
Rachel Stanley here for Depo-Provera  Injection.  Injection administered without complication. Patient will return in 3 months for next injection.  Ralene Bathe, RN 06/21/2019  10:20 AM

## 2019-06-27 NOTE — Progress Notes (Signed)
Chart reviewed for nurse visit. Agree with plan of care.   Duane Lope, NP 06/27/2019 3:22 PM

## 2019-08-22 ENCOUNTER — Encounter: Payer: Self-pay | Admitting: Nurse Practitioner

## 2019-08-22 ENCOUNTER — Ambulatory Visit: Payer: Medicaid Other | Attending: Nurse Practitioner | Admitting: Nurse Practitioner

## 2019-08-22 ENCOUNTER — Other Ambulatory Visit: Payer: Self-pay

## 2019-08-22 DIAGNOSIS — F17210 Nicotine dependence, cigarettes, uncomplicated: Secondary | ICD-10-CM | POA: Diagnosis not present

## 2019-08-22 DIAGNOSIS — M65321 Trigger finger, right index finger: Secondary | ICD-10-CM | POA: Insufficient documentation

## 2019-08-22 DIAGNOSIS — Z7689 Persons encountering health services in other specified circumstances: Secondary | ICD-10-CM | POA: Diagnosis not present

## 2019-08-22 DIAGNOSIS — F17200 Nicotine dependence, unspecified, uncomplicated: Secondary | ICD-10-CM | POA: Diagnosis not present

## 2019-08-22 NOTE — Progress Notes (Signed)
Virtual Visit via Telephone Note Due to national recommendations of social distancing due to [place] 19, telehealth visit is felt to be most appropriate for this patient at this time.  I discussed the limitations, risks, security and privacy concerns of performing an evaluation and management service by telephone and the availability of in person appointments. I also discussed with the patient that there may be a patient responsible charge related to this service. The patient expressed understanding and agreed to proceed.    I connected with Rachel Stanley on 08/22/19  at  10:30 AM EDT  EDT by telephone and verified that I am speaking with the correct person using two identifiers.   Consent I discussed the limitations, risks, security and privacy concerns of performing an evaluation and management service by telephone and the availability of in person appointments. I also discussed with the patient that there may be a patient responsible charge related to this service. The patient expressed understanding and agreed to proceed.   Location of Patient: Private  Residence    Location of Provider: [place] participating in Telemedicine visit: Geryl Rankins FNP-BC [place]    History of Present Illness: Telemedicine visit for: Establish Care  Right hand pain. Index and pointer finger get locked in a certain position. She has to manually pry the fingers open Occurring every day. Worse in the morning. Wearing compression sleeve during the night which has provided some relief.  Onset over a year ago. She denies any neuropathy symptoms of numbness, burning, tingling. 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     Past Medical History:  Diagnosis Date  . Asthma   . Medical history non-contributory     Past Surgical History:  Procedure Laterality Date  . NO PAST SURGERIES      Family History  Problem Relation Age of Onset  . Heart  disease Mother   . Heart disease Father     Social History   Socioeconomic History  . Marital status: Single    Spouse name: Not on file  . Number of children: Not on file  . Years of education: Not on file  . Highest education level: Not on file  Occupational History  . Not on file  Tobacco Use  . Smoking status: Current Some Day Smoker    Types: Cigarettes  . Smokeless tobacco: Former Network engineer and Sexual Activity  . Alcohol use: Not Currently  . Drug use: Not Currently    Types: Marijuana, "Crack" cocaine    Comment: clast crack/cocaine was 3.5-4 months, marijuanna occasionally,,  . Sexual activity: Yes    Birth control/protection: Injection  Other Topics Concern  . Not on file  Social History Narrative  . Not on file   Social Determinants of Health   Financial Resource Strain:   . Difficulty of Paying Living Expenses:   Food Insecurity:   . Worried About Charity fundraiser in the Last Year:   . Arboriculturist in the Last Year:   Transportation Needs:   . Film/video editor (Medical):   Marland Kitchen Lack of Transportation (Non-Medical):   Physical Activity:   . Days of Exercise per Week:   . Minutes of Exercise per Session:   Stress:   . Feeling of Stress :   Social Connections:   . Frequency of Communication with Friends and Family:   . Frequency of Social Gatherings with  Friends and Family:   . Attends Religious Services:   . Active Member of Clubs or Organizations:   . Attends Banker Meetings:   Marland Kitchen Marital Status:      Observations/Objective: Awake, alert and oriented x 3   Review of Systems  Constitutional: Negative for fever, malaise/fatigue and weight loss.  HENT: Negative.  Negative for nosebleeds.   Eyes: Negative.  Negative for blurred vision, double vision and photophobia.  Respiratory: Negative.  Negative for cough and shortness of breath.   Cardiovascular: Negative.  Negative for chest pain, palpitations and leg swelling.   Gastrointestinal: Negative.  Negative for heartburn, nausea and vomiting.  Musculoskeletal: Negative.  Negative for myalgias.  Neurological: Negative.  Negative for dizziness, focal weakness, seizures and headaches.  Psychiatric/Behavioral: Negative.  Negative for suicidal ideas.    Assessment and Plan: [name] was seen today for new patient (initial visit).  Diagnoses and all orders for this visit:  Encounter to establish care  Tobacco dependence Keyonta was counseled on the dangers of tobacco use, and was advised to quit. Reviewed strategies to maximize success, including removing cigarettes and smoking materials from environment, stress management and support of family/friends as well as pharmacological alternatives including: Wellbutrin, Chantix, Nicotine patch, Nicotine gum or lozenges. Smoking cessation support: smoking cessation hotline: 1-800-QUIT-NOW.  Smoking cessation classes are also available through [facility]. Call 360-426-4317 or visit our website at [email or web address].   A total of 2 minutes was spent on counseling for smoking cessation and Makennah is not ready to quit.   Trigger index finger of right hand -     Ambulatory referral to Hand Surgery     Follow Up Instructions Return for PAP SMEAR.     I discussed the assessment and treatment plan with the patient. The patient was provided an opportunity to ask questions and all were answered. The patient agreed with the plan and demonstrated an understanding of the instructions.   The patient was advised to call back or seek an in-person evaluation if the symptoms worsen or if the condition fails to improve as anticipated.  I provided 16 minutes of non-face-to-face time during this encounter including median intraservice time, reviewing previous notes, labs, imaging, medications and explaining diagnosis and management.  Claiborne Rigg, FNP-BC

## 2019-09-03 ENCOUNTER — Ambulatory Visit: Payer: Medicaid Other | Admitting: Orthopaedic Surgery

## 2019-09-06 ENCOUNTER — Ambulatory Visit: Payer: Medicaid Other

## 2019-09-17 ENCOUNTER — Ambulatory Visit: Payer: Medicaid Other | Admitting: Nurse Practitioner

## 2019-09-18 ENCOUNTER — Other Ambulatory Visit: Payer: Self-pay

## 2019-09-18 ENCOUNTER — Encounter (HOSPITAL_COMMUNITY): Payer: Self-pay

## 2019-09-18 ENCOUNTER — Emergency Department (HOSPITAL_COMMUNITY): Payer: Medicaid Other

## 2019-09-18 ENCOUNTER — Emergency Department (HOSPITAL_COMMUNITY)
Admission: EM | Admit: 2019-09-18 | Discharge: 2019-09-18 | Disposition: A | Discharge status: Home / Self Care | Payer: Medicaid Other | Attending: Emergency Medicine | Admitting: Emergency Medicine

## 2019-09-18 DIAGNOSIS — R079 Chest pain, unspecified: Secondary | ICD-10-CM | POA: Diagnosis not present

## 2019-09-18 DIAGNOSIS — Z720 Tobacco use: Secondary | ICD-10-CM | POA: Insufficient documentation

## 2019-09-18 DIAGNOSIS — J189 Pneumonia, unspecified organism: Secondary | ICD-10-CM | POA: Diagnosis not present

## 2019-09-18 DIAGNOSIS — Z20822 Contact with and (suspected) exposure to covid-19: Secondary | ICD-10-CM | POA: Insufficient documentation

## 2019-09-18 DIAGNOSIS — R0789 Other chest pain: Secondary | ICD-10-CM | POA: Diagnosis not present

## 2019-09-18 DIAGNOSIS — J45901 Unspecified asthma with (acute) exacerbation: Secondary | ICD-10-CM | POA: Diagnosis not present

## 2019-09-18 DIAGNOSIS — R0602 Shortness of breath: Secondary | ICD-10-CM | POA: Diagnosis present

## 2019-09-18 DIAGNOSIS — R05 Cough: Secondary | ICD-10-CM | POA: Diagnosis not present

## 2019-09-18 LAB — BASIC METABOLIC PANEL
Anion gap: 12 (ref 5–15)
BUN: 6 mg/dL (ref 6–20)
CO2: 22 mmol/L (ref 22–32)
Calcium: 8.9 mg/dL (ref 8.9–10.3)
Chloride: 103 mmol/L (ref 98–111)
Creatinine, Ser: 1.07 mg/dL — ABNORMAL HIGH (ref 0.44–1.00)
GFR calc Af Amer: >60 mL/min (ref >60)
GFR calc non Af Amer: >60 mL/min (ref >60)
Glucose, Bld: 142 mg/dL — ABNORMAL HIGH (ref 70–99)
Potassium: 4.4 mmol/L (ref 3.5–5.1)
Sodium: 137 mmol/L (ref 135–145)

## 2019-09-18 LAB — CBC
HCT: 37.2 % (ref 36.0–46.0)
Hemoglobin: 11.8 g/dL — ABNORMAL LOW (ref 12.0–15.0)
MCH: 26.5 pg (ref 26.0–34.0)
MCHC: 31.7 g/dL (ref 30.0–36.0)
MCV: 83.4 fL (ref 80.0–100.0)
Platelets: 385 10*3/uL (ref 150–400)
RBC: 4.46 MIL/uL (ref 3.87–5.11)
RDW: 15.2 % (ref 11.5–15.5)
WBC: 6.7 10*3/uL (ref 4.0–10.5)
nRBC: 0 % (ref 0.0–0.2)

## 2019-09-18 LAB — TROPONIN I (HIGH SENSITIVITY)
Troponin I (High Sensitivity): 3 ng/L (ref <18)
Troponin I (High Sensitivity): 3 ng/L (ref <18)

## 2019-09-18 LAB — I-STAT BETA HCG BLOOD, ED (MC, WL, AP ONLY): I-stat hCG, quantitative: <5 m[IU]/mL (ref <5)

## 2019-09-18 LAB — SARS CORONAVIRUS 2 BY RT PCR (HOSPITAL ORDER, PERFORMED IN ~~LOC~~ HOSPITAL LAB): SARS Coronavirus 2: NEGATIVE

## 2019-09-18 MED ORDER — ALBUTEROL SULFATE HFA 108 (90 BASE) MCG/ACT IN AERS
8.0000 | INHALATION_SPRAY | Freq: Once | RESPIRATORY_TRACT | Status: AC
  Administered 2019-09-18: 8 via RESPIRATORY_TRACT
  Filled 2019-09-18: qty 6.7

## 2019-09-18 MED ORDER — PREDNISONE 20 MG PO TABS
60.0000 mg | ORAL_TABLET | Freq: Once | ORAL | Status: AC
  Administered 2019-09-18: 60 mg via ORAL
  Filled 2019-09-18: qty 3

## 2019-09-18 MED ORDER — PREDNISONE 20 MG PO TABS
40.0000 mg | ORAL_TABLET | Freq: Every day | ORAL | 0 refills | Status: DC
Start: 2019-09-18 — End: 2019-11-17

## 2019-09-18 MED ORDER — DOXYCYCLINE HYCLATE 100 MG PO TABS
100.0000 mg | ORAL_TABLET | Freq: Once | ORAL | Status: AC
  Administered 2019-09-18: 100 mg via ORAL
  Filled 2019-09-18: qty 1

## 2019-09-18 MED ORDER — DOXYCYCLINE HYCLATE 100 MG PO CAPS
100.0000 mg | ORAL_CAPSULE | Freq: Two times a day (BID) | ORAL | 0 refills | Status: DC
Start: 2019-09-18 — End: 2019-11-17

## 2019-09-18 NOTE — Discharge Instructions (Addendum)
Follow-up with your doctor as needed.  Return for worsening shortness of breath ?

## 2019-09-18 NOTE — ED Triage Notes (Signed)
Pt reports chest pain and SOB since yesterday, hx of asthma, used inhaler without relief. Wheezing noted in triage

## 2019-09-18 NOTE — ED Provider Notes (Signed)
[facility] EMERGENCY DEPARTMENT Provider Note   CSN: [id number] Arrival date & time: 09/18/19  1158     History Chief Complaint  Patient presents with  . Shortness of Breath  . Chest Pain    Rachel Stanley is a 34 y.o. female.  HPI Patient presents with chest pain shortness of breath.  Has had for the last 2 to 3 days.  States she has had a cough with green sputum.  States she feels cold at times.  Has an inhaler at home that she has been using without relief.  History of asthma.  Not had fevers.  No sick contacts.  Has not had her Covid vaccinations.  States she is feeling better somewhat now.  Waited around 8 hours to be seen by me.  Has been using an inhaler out in the waiting room.    Past Medical History:  Diagnosis Date  . Asthma   . Medical history non-contributory     Patient Active Problem List   Diagnosis Date Noted  . Indication for care in labor or delivery 01/14/2019    Past Surgical History:  Procedure Laterality Date  . NO PAST SURGERIES       OB History    Gravida  6   Para  6   Term  3   Preterm  3   AB      Living  5     SAB      TAB      Ectopic      Multiple  0   Live Births  6           Family History  Problem Relation Age of Onset  . Heart disease Mother   . Heart disease Father     Social History   Tobacco Use  . Smoking status: Current Some Day Smoker    Types: Cigarettes  . Smokeless tobacco: Former Engineer, water Use Topics  . Alcohol use: Not Currently  . Drug use: Not Currently    Types: Marijuana, "Crack" cocaine    Comment: clast crack/cocaine was 3.5-4 months, marijuanna occasionally,,    Home Medications Prior to Admission medications   Medication Sig Start Date End Date Taking? Authorizing Provider  acetaminophen (TYLENOL) 500 MG tablet Take 2 tablets (1,000 mg total) by mouth every 8 (eight) hours as needed. 12/19/18   Fair, Hoyle Sauer, MD  albuterol (PROVENTIL) (2.5 MG/3ML)  0.083% nebulizer solution Take 3 mLs (2.5 mg total) by nebulization every 6 (six) hours as needed for wheezing or shortness of breath. 06/12/19   Wieters, Hallie C, PA-C  albuterol (VENTOLIN HFA) 108 (90 Base) MCG/ACT inhaler Inhale 1-2 puffs into the lungs every 6 (six) hours as needed for wheezing or shortness of breath. 06/12/19   Wieters, Hallie C, PA-C  ferrous sulfate 325 (65 FE) MG tablet Take 1 tablet (325 mg total) by mouth every other day. 12/19/18 12/19/19  FairHoyle Sauer, MD  fluticasone (FLONASE) 50 MCG/ACT nasal spray Place 1-2 sprays into both nostrils daily for 7 days. 06/12/19 06/19/19  Wieters, Hallie C, PA-C  ibuprofen (ADVIL) 600 MG tablet Take 1 tablet (600 mg total) by mouth every 6 (six) hours. 01/16/19   Cresenzo-Dishmon, Scarlette Calico, CNM    Allergies    Macrobid [nitrofurantoin] and Penicillins  Review of Systems   Review of Systems  Constitutional: Negative for appetite change.  Respiratory: Positive for cough and shortness of breath.   Cardiovascular: Positive for chest pain.  Gastrointestinal: Negative for abdominal distention and abdominal pain.  Genitourinary: Negative for flank pain.  Musculoskeletal: Negative for back pain.  Skin: Negative for rash.  Neurological: Negative for weakness.  Psychiatric/Behavioral: Negative for confusion.    Physical Exam Updated Vital Signs BP 108/61 (BP Location: Right Arm)   Pulse 100   Temp 98.4 F (36.9 C) (Oral)   Resp 16   SpO2 100%   Physical Exam Vitals and nursing note reviewed.  HENT:     Head: Normocephalic.  Cardiovascular:     Rate and Rhythm: Normal rate and regular rhythm.  Pulmonary:     Comments: Harsh breath sounds without focal rales or rhonchi. Abdominal:     Palpations: Abdomen is soft.  Musculoskeletal:     Right lower leg: No edema.     Left lower leg: No edema.  Skin:    General: Skin is warm.     Capillary Refill: Capillary refill takes less than 2 seconds.  Neurological:     Mental Status:  She is alert and oriented to person, place, and time.     ED Results / Procedures / Treatments   Labs (all labs ordered are listed, but only abnormal results are displayed) Labs Reviewed  BASIC METABOLIC PANEL - Abnormal; Notable for the following components:      Result Value   Glucose, Bld 142 (*)    Creatinine, Ser 1.07 (*)    All other components within normal limits  CBC - Abnormal; Notable for the following components:   Hemoglobin 11.8 (*)    All other components within normal limits  SARS CORONAVIRUS 2 BY RT PCR (HOSPITAL ORDER, [place] [facility])  I-STAT BETA HCG BLOOD, ED ([facility], [facility], [facility] ONLY)  TROPONIN I (HIGH SENSITIVITY)  TROPONIN I (HIGH SENSITIVITY)    EKG None  Radiology DG Chest 2 View  Result Date: 09/18/2019 CLINICAL DATA:  Central chest pain with cough and thick green mucus since yesterday, intermittent shortness of breath, history asthma EXAM: CHEST - 2 VIEW COMPARISON:  11/16/2006 FINDINGS: Normal heart size, mediastinal contours, and pulmonary vascularity. Minimal central peribronchial thickening. Slight accentuation of RIGHT infrahilar markings, cannot exclude subtle infiltrate. Remaining lungs clear. No pleural effusion or pneumothorax. Minimal biconvex thoracic scoliosis. IMPRESSION: Minimal bronchitic changes with question subtle RIGHT infrahilar infiltrate. Electronically Signed   By: Lavonia Dana M.D.   On: 09/18/2019 12:51    Procedures Procedures (including critical care time)  Medications Ordered in ED Medications  doxycycline (VIBRA-TABS) tablet 100 mg (has no administration in time range)  predniSONE (DELTASONE) tablet 60 mg (has no administration in time range)  albuterol (VENTOLIN HFA) 108 (90 Base) MCG/ACT inhaler 8 puff (8 puffs Inhalation Given 09/18/19 1224)    ED Course  I have reviewed the triage vital signs and the nursing notes.  Pertinent labs & imaging results that were available during my care of the patient were  reviewed by me and considered in my medical decision making (see chart for details).    MDM Rules/Calculators/A&P                      Patient with wheezes and cough with sputum production.  History of asthma.  X-ray done showed possible pneumonia.  Patient states she is feeling better after getting the albuterol while waiting to see me.  Not hypoxic.  Feels the patient benefit from steroids and antibiotics.  Has been given inhaler.  Discharge home with outpatient follow-up as needed.  Final Clinical Impression(s) / ED Diagnoses Final diagnoses:  None    Rx / DC Orders ED Discharge Orders    None       Benjiman Core, MD 09/18/19 2121

## 2019-11-16 ENCOUNTER — Emergency Department (HOSPITAL_COMMUNITY): Payer: Medicaid Other

## 2019-11-16 ENCOUNTER — Emergency Department (HOSPITAL_COMMUNITY)
Admission: EM | Admit: 2019-11-16 | Discharge: 2019-11-17 | Disposition: A | Discharge status: Home / Self Care | Payer: Medicaid Other | Attending: Emergency Medicine | Admitting: Emergency Medicine

## 2019-11-16 ENCOUNTER — Encounter (HOSPITAL_COMMUNITY): Payer: Self-pay | Admitting: Emergency Medicine

## 2019-11-16 ENCOUNTER — Other Ambulatory Visit: Payer: Self-pay

## 2019-11-16 DIAGNOSIS — S8991XA Unspecified injury of right lower leg, initial encounter: Secondary | ICD-10-CM | POA: Diagnosis not present

## 2019-11-16 DIAGNOSIS — J45909 Unspecified asthma, uncomplicated: Secondary | ICD-10-CM | POA: Insufficient documentation

## 2019-11-16 DIAGNOSIS — Y929 Unspecified place or not applicable: Secondary | ICD-10-CM | POA: Insufficient documentation

## 2019-11-16 DIAGNOSIS — W010XXA Fall on same level from slipping, tripping and stumbling without subsequent striking against object, initial encounter: Secondary | ICD-10-CM | POA: Diagnosis not present

## 2019-11-16 DIAGNOSIS — Y999 Unspecified external cause status: Secondary | ICD-10-CM | POA: Insufficient documentation

## 2019-11-16 DIAGNOSIS — F17210 Nicotine dependence, cigarettes, uncomplicated: Secondary | ICD-10-CM | POA: Diagnosis not present

## 2019-11-16 DIAGNOSIS — Y939 Activity, unspecified: Secondary | ICD-10-CM | POA: Insufficient documentation

## 2019-11-16 DIAGNOSIS — S8391XA Sprain of unspecified site of right knee, initial encounter: Secondary | ICD-10-CM | POA: Insufficient documentation

## 2019-11-16 NOTE — ED Triage Notes (Signed)
Patient slipped and fell at work this evening , no LOC /ambulatory , reports pain at right knee with mild swelling .

## 2019-11-17 MED ORDER — IBUPROFEN 400 MG PO TABS
400.0000 mg | ORAL_TABLET | Freq: Once | ORAL | Status: AC
  Administered 2019-11-17: 400 mg via ORAL
  Filled 2019-11-17: qty 1

## 2019-11-17 NOTE — ED Notes (Signed)
All discharge instructions reviewed with pt, who verbalizes understanding. Pt discharged without issue.

## 2019-11-17 NOTE — ED Provider Notes (Signed)
[facility] EMERGENCY DEPARTMENT Provider Note   CSN: [id number] Arrival date & time: 11/16/19  2207     History Chief Complaint  Patient presents with   Knee Injury    Rachel Stanley is a 34 y.o. female.  The history is provided by the patient.  Knee Pain Location:  Knee Knee location:  R knee Pain details:    Quality:  Aching   Radiates to:  Does not radiate   Severity:  Moderate   Onset quality:  Sudden   Timing:  Constant   Progression:  Worsening Chronicity:  New Relieved by:  Elevation Worsened by:  Bearing weight, extension and flexion Patient works at [employer].  She reports while standing her right knee gave out and she hit the floor with her knee.  No other injuries reported.  She reports it hurts to walk.  She reports previous injury to the same knee     Past Medical History:  Diagnosis Date   Asthma    Medical history non-contributory     Patient Active Problem List   Diagnosis Date Noted   Indication for care in labor or delivery 01/14/2019    Past Surgical History:  Procedure Laterality Date   NO PAST SURGERIES       OB History    Gravida  6   Para  6   Term  3   Preterm  3   AB      Living  5     SAB      TAB      Ectopic      Multiple  0   Live Births  6           Family History  Problem Relation Age of Onset   Heart disease Mother    Heart disease Father     Social History   Tobacco Use   Smoking status: Current Some Day Smoker    Types: Cigarettes   Smokeless tobacco: Former Neurosurgeon  Substance Use Topics   Alcohol use: Not Currently   Drug use: Not Currently    Types: Marijuana, "Crack" cocaine    Comment: clast crack/cocaine was 3.5-4 months, marijuanna occasionally,,    Home Medications Prior to Admission medications   Medication Sig Start Date End Date Taking? Authorizing Provider  ferrous sulfate 325 (65 FE) MG tablet Take 1 tablet (325 mg total) by mouth every other  day. 12/19/18 12/19/19  FairHoyle Sauer, MD  fluticasone (FLONASE) 50 MCG/ACT nasal spray Place 1-2 sprays into both nostrils daily for 7 days. 06/12/19 06/19/19  Wieters, Hallie C, PA-C  albuterol (PROVENTIL) (2.5 MG/3ML) 0.083% nebulizer solution Take 3 mLs (2.5 mg total) by nebulization every 6 (six) hours as needed for wheezing or shortness of breath. 06/12/19 11/17/19  Wieters, Hallie C, PA-C    Allergies    Macrobid [nitrofurantoin] and Penicillins  Review of Systems   Review of Systems  Musculoskeletal: Positive for arthralgias.  Neurological: Negative for weakness.    Physical Exam Updated Vital Signs BP (!) 119/55 (BP Location: Left Arm)    Pulse 72    Temp 99.3 F (37.4 C) (Oral)    Resp 16    Ht 1.6 m (5\' 3" )    Wt (!) 100 kg    SpO2 100%    BMI 39.05 kg/m   Physical Exam CONSTITUTIONAL: Well developed/well nourished HEAD: Normocephalic/atraumatic EYES: EOMI ENMT: Mask in place NECK: supple no meningeal signs CV: S1/S2 noted, no  murmurs/rubs/gallops noted LUNGS: Lungs are clear to auscultation bilaterally, no apparent distress ABDOMEN: soft, nontender NEURO: Pt is awake/alert/appropriate, moves all extremitiesx4.  No facial droop.   EXTREMITIES: pulses normal/equal, full ROM, tenderness noted to right knee.  Patient is able to keep knee extended while flexing hip.  Patient does have limitation in range of motion of flexion of right knee.  There appears to be a small effusion.  No deformities.  Distal pulses intact SKIN: warm, color normal PSYCH: no abnormalities of mood noted, alert and oriented to situation  ED Results / Procedures / Treatments   Labs (all labs ordered are listed, but only abnormal results are displayed) Labs Reviewed - No data to display  EKG None  Radiology DG Knee Complete 4 Views Right  Result Date: 11/16/2019 CLINICAL DATA:  Status post fall. EXAM: RIGHT KNEE - COMPLETE 4+ VIEW COMPARISON:  May 20, 2008 FINDINGS: No evidence of fracture,  dislocation, or joint effusion. No evidence of arthropathy or other focal bone abnormality. Soft tissues are unremarkable. IMPRESSION: Negative. Electronically Signed   By: Aram Candela M.D.   On: 11/16/2019 23:15    Procedures Procedures  Medications Ordered in ED Medications  ibuprofen (ADVIL) tablet 400 mg (400 mg Oral Given 11/17/19 0448)    ED Course  I have reviewed the triage vital signs and the nursing notes.  Pertinent  imaging results that were available during my care of the patient were reviewed by me and considered in my medical decision making (see chart for details).    MDM Rules/Calculators/A&P                          Plan to apply Ace wrap, crutches, and orthopedic follow-up.  Patient is comfortable using ibuprofen for pain Final Clinical Impression(s) / ED Diagnoses Final diagnoses:  Sprain of right knee, unspecified ligament, initial encounter    Rx / DC Orders ED Discharge Orders    None       Zadie Rhine, MD 11/17/19 [phone]

## 2019-11-20 DIAGNOSIS — H40013 Open angle with borderline findings, low risk, bilateral: Secondary | ICD-10-CM | POA: Diagnosis not present

## 2019-11-20 DIAGNOSIS — H16223 Keratoconjunctivitis sicca, not specified as Sjogren's, bilateral: Secondary | ICD-10-CM | POA: Diagnosis not present

## 2019-11-21 DIAGNOSIS — H5213 Myopia, bilateral: Secondary | ICD-10-CM | POA: Diagnosis not present

## 2019-11-27 DIAGNOSIS — M25561 Pain in right knee: Secondary | ICD-10-CM | POA: Diagnosis not present

## 2019-12-11 ENCOUNTER — Other Ambulatory Visit: Payer: Self-pay

## 2019-12-11 ENCOUNTER — Encounter (HOSPITAL_COMMUNITY): Payer: Self-pay | Admitting: Emergency Medicine

## 2019-12-11 ENCOUNTER — Emergency Department (HOSPITAL_COMMUNITY)
Admission: EM | Admit: 2019-12-11 | Discharge: 2019-12-11 | Disposition: A | Discharge status: Home / Self Care | Payer: Medicaid Other | Attending: Emergency Medicine | Admitting: Emergency Medicine

## 2019-12-11 DIAGNOSIS — R519 Headache, unspecified: Secondary | ICD-10-CM | POA: Insufficient documentation

## 2019-12-11 DIAGNOSIS — R42 Dizziness and giddiness: Secondary | ICD-10-CM | POA: Diagnosis not present

## 2019-12-11 DIAGNOSIS — Z5321 Procedure and treatment not carried out due to patient leaving prior to being seen by health care provider: Secondary | ICD-10-CM | POA: Diagnosis not present

## 2019-12-11 DIAGNOSIS — H538 Other visual disturbances: Secondary | ICD-10-CM | POA: Diagnosis not present

## 2019-12-11 NOTE — ED Triage Notes (Signed)
Pt c/o headache for several days. Nothing relieving pains. Reports dizziness and blurred vision.

## 2019-12-26 DIAGNOSIS — M25561 Pain in right knee: Secondary | ICD-10-CM | POA: Diagnosis not present

## 2020-01-03 DIAGNOSIS — R062 Wheezing: Secondary | ICD-10-CM | POA: Diagnosis not present

## 2020-02-01 DIAGNOSIS — M25561 Pain in right knee: Secondary | ICD-10-CM | POA: Diagnosis not present

## 2020-03-21 DIAGNOSIS — J029 Acute pharyngitis, unspecified: Secondary | ICD-10-CM | POA: Diagnosis not present

## 2020-05-07 DIAGNOSIS — U071 COVID-19: Secondary | ICD-10-CM | POA: Diagnosis not present

## 2020-05-07 DIAGNOSIS — J069 Acute upper respiratory infection, unspecified: Secondary | ICD-10-CM | POA: Diagnosis not present

## 2020-05-07 DIAGNOSIS — R059 Cough, unspecified: Secondary | ICD-10-CM | POA: Diagnosis not present

## 2020-10-27 DIAGNOSIS — Z202 Contact with and (suspected) exposure to infections with a predominantly sexual mode of transmission: Secondary | ICD-10-CM | POA: Diagnosis not present

## 2020-10-27 DIAGNOSIS — R399 Unspecified symptoms and signs involving the genitourinary system: Secondary | ICD-10-CM | POA: Diagnosis not present

## 2020-10-27 DIAGNOSIS — R1030 Lower abdominal pain, unspecified: Secondary | ICD-10-CM | POA: Diagnosis not present

## 2020-10-27 DIAGNOSIS — R109 Unspecified abdominal pain: Secondary | ICD-10-CM | POA: Diagnosis not present

## 2022-03-22 ENCOUNTER — Emergency Department (HOSPITAL_COMMUNITY)
Admission: EM | Admit: 2022-03-22 | Discharge: 2022-03-23 | Discharge status: Against Medical Advice | Payer: Medicaid Other | Attending: Emergency Medicine | Admitting: Emergency Medicine

## 2022-03-22 ENCOUNTER — Other Ambulatory Visit: Payer: Self-pay

## 2022-03-22 ENCOUNTER — Emergency Department (HOSPITAL_COMMUNITY): Payer: Medicaid Other

## 2022-03-22 DIAGNOSIS — R059 Cough, unspecified: Secondary | ICD-10-CM | POA: Diagnosis not present

## 2022-03-22 DIAGNOSIS — Z5321 Procedure and treatment not carried out due to patient leaving prior to being seen by health care provider: Secondary | ICD-10-CM | POA: Insufficient documentation

## 2022-03-22 DIAGNOSIS — R079 Chest pain, unspecified: Secondary | ICD-10-CM | POA: Diagnosis not present

## 2022-03-22 DIAGNOSIS — Z1152 Encounter for screening for COVID-19: Secondary | ICD-10-CM | POA: Diagnosis not present

## 2022-03-22 DIAGNOSIS — R0602 Shortness of breath: Secondary | ICD-10-CM | POA: Diagnosis not present

## 2022-03-22 DIAGNOSIS — R0789 Other chest pain: Secondary | ICD-10-CM | POA: Diagnosis not present

## 2022-03-22 DIAGNOSIS — J9811 Atelectasis: Secondary | ICD-10-CM | POA: Diagnosis not present

## 2022-03-22 DIAGNOSIS — J45909 Unspecified asthma, uncomplicated: Secondary | ICD-10-CM | POA: Diagnosis not present

## 2022-03-22 LAB — CBC WITH DIFFERENTIAL/PLATELET
Abs Immature Granulocytes: 0.01 10*3/uL (ref 0.00–0.07)
Basophils Absolute: 0 10*3/uL (ref 0.0–0.1)
Basophils Relative: 0 %
Eosinophils Absolute: 1.6 10*3/uL — ABNORMAL HIGH (ref 0.0–0.5)
Eosinophils Relative: 16 %
HCT: 41.2 % (ref 36.0–46.0)
Hemoglobin: 13.1 g/dL (ref 12.0–15.0)
Immature Granulocytes: 0 %
Lymphocytes Relative: 23 %
Lymphs Abs: 2.3 10*3/uL (ref 0.7–4.0)
MCH: 26 pg (ref 26.0–34.0)
MCHC: 31.8 g/dL (ref 30.0–36.0)
MCV: 81.9 fL (ref 80.0–100.0)
Monocytes Absolute: 0.5 10*3/uL (ref 0.1–1.0)
Monocytes Relative: 5 %
Neutro Abs: 5.4 10*3/uL (ref 1.7–7.7)
Neutrophils Relative %: 56 %
Platelets: 537 10*3/uL — ABNORMAL HIGH (ref 150–400)
RBC: 5.03 MIL/uL (ref 3.87–5.11)
RDW: 15.4 % (ref 11.5–15.5)
WBC: 9.9 10*3/uL (ref 4.0–10.5)
nRBC: 0 % (ref 0.0–0.2)

## 2022-03-22 LAB — BASIC METABOLIC PANEL
Anion gap: 12 (ref 5–15)
BUN: 9 mg/dL (ref 6–20)
CO2: 24 mmol/L (ref 22–32)
Calcium: 9.1 mg/dL (ref 8.9–10.3)
Chloride: 102 mmol/L (ref 98–111)
Creatinine, Ser: 0.75 mg/dL (ref 0.44–1.00)
GFR, Estimated: >60 mL/min (ref >60)
Glucose, Bld: 101 mg/dL — ABNORMAL HIGH (ref 70–99)
Potassium: 4 mmol/L (ref 3.5–5.1)
Sodium: 138 mmol/L (ref 135–145)

## 2022-03-22 LAB — RESP PANEL BY RT-PCR (FLU A&B, COVID) ARPGX2
Influenza A by PCR: NEGATIVE
Influenza B by PCR: NEGATIVE
SARS Coronavirus 2 by RT PCR: NEGATIVE

## 2022-03-22 LAB — I-STAT BETA HCG BLOOD, ED (MC, WL, AP ONLY): I-stat hCG, quantitative: <5 m[IU]/mL (ref <5)

## 2022-03-22 MED ORDER — IPRATROPIUM-ALBUTEROL 0.5-2.5 (3) MG/3ML IN SOLN
3.0000 mL | Freq: Once | RESPIRATORY_TRACT | Status: AC
  Administered 2022-03-22: 3 mL via RESPIRATORY_TRACT
  Filled 2022-03-22: qty 3

## 2022-03-22 MED ORDER — PREDNISONE 20 MG PO TABS
60.0000 mg | ORAL_TABLET | Freq: Once | ORAL | Status: AC
  Administered 2022-03-22: 60 mg via ORAL
  Filled 2022-03-22: qty 3

## 2022-03-22 NOTE — ED Triage Notes (Signed)
Pt presents with chest pain and cough x 1 week.  Pt states OTC meds aren't helping. Does smoke 5 cigarettes per day.

## 2022-03-22 NOTE — ED Provider Triage Note (Signed)
Emergency Medicine Provider Triage Evaluation Note  Rachel Stanley , a 36 y.o. female  was evaluated in triage.  Pt complains of SOB, cough worsening x 2 weeks. Hx of asthma, associated chest pain and post-tussive emesis.   Review of Systems  Positive: As above, rhinorrhea Negative: Syncope, palpitations  Physical Exam  BP (!) 135/114 (BP Location: Right Arm)   Pulse (!) 111   Temp 98.1 F (36.7 C) (Oral)   Resp (!) 24   SpO2 99%  Gen:   Awake, coughing throughout exam   Resp:  Tachypneic, accessory msucle use, wheezing throughout MSK:   Moves extremities without difficulty  Other:  No LE edema  Medical Decision Making  Medically screening exam initiated at 10:19 PM.  Appropriate orders placed.  Rachel Stanley was informed that the remainder of the evaluation will be completed by another provider, this initial triage assessment does not replace that evaluation, and the importance of remaining in the ED until their evaluation is complete.  This chart was dictated using voice recognition software, Dragon. Despite the best efforts of this provider to proofread and correct errors, errors may still occur which can change documentation meaning.    Paris Lore, PA-C 03/22/22 2227

## 2022-03-23 LAB — TROPONIN I (HIGH SENSITIVITY)
Troponin I (High Sensitivity): 4 ng/L (ref <18)
Troponin I (High Sensitivity): 3 ng/L (ref <18)

## 2022-03-23 NOTE — ED Notes (Signed)
Called by sort multiple times and no response...the patient cannot be found

## 2022-03-23 NOTE — ED Notes (Signed)
Patient requesting breathing tx. States she is wheezing. Triage rn Bobby notified.

## 2022-03-28 ENCOUNTER — Emergency Department (HOSPITAL_COMMUNITY): Payer: Medicaid Other

## 2022-03-28 ENCOUNTER — Encounter (HOSPITAL_COMMUNITY): Payer: Self-pay

## 2022-03-28 ENCOUNTER — Inpatient Hospital Stay (HOSPITAL_COMMUNITY)
Admission: EM | Admit: 2022-03-28 | Discharge: 2022-03-31 | DRG: 193 | Discharge status: Against Medical Advice | Payer: Medicaid Other | Attending: Internal Medicine | Admitting: Internal Medicine

## 2022-03-28 DIAGNOSIS — R000 Tachycardia, unspecified: Secondary | ICD-10-CM | POA: Diagnosis not present

## 2022-03-28 DIAGNOSIS — J9601 Acute respiratory failure with hypoxia: Secondary | ICD-10-CM

## 2022-03-28 DIAGNOSIS — J45901 Unspecified asthma with (acute) exacerbation: Secondary | ICD-10-CM | POA: Diagnosis not present

## 2022-03-28 DIAGNOSIS — J4521 Mild intermittent asthma with (acute) exacerbation: Secondary | ICD-10-CM | POA: Diagnosis present

## 2022-03-28 DIAGNOSIS — J209 Acute bronchitis, unspecified: Secondary | ICD-10-CM | POA: Diagnosis present

## 2022-03-28 DIAGNOSIS — Z5329 Procedure and treatment not carried out because of patient's decision for other reasons: Secondary | ICD-10-CM | POA: Diagnosis present

## 2022-03-28 DIAGNOSIS — Z6835 Body mass index (BMI) 35.0-35.9, adult: Secondary | ICD-10-CM

## 2022-03-28 DIAGNOSIS — J189 Pneumonia, unspecified organism: Principal | ICD-10-CM | POA: Diagnosis present

## 2022-03-28 DIAGNOSIS — J40 Bronchitis, not specified as acute or chronic: Secondary | ICD-10-CM | POA: Diagnosis not present

## 2022-03-28 DIAGNOSIS — Z880 Allergy status to penicillin: Secondary | ICD-10-CM

## 2022-03-28 DIAGNOSIS — E876 Hypokalemia: Secondary | ICD-10-CM | POA: Diagnosis not present

## 2022-03-28 DIAGNOSIS — R062 Wheezing: Secondary | ICD-10-CM | POA: Diagnosis not present

## 2022-03-28 DIAGNOSIS — J80 Acute respiratory distress syndrome: Secondary | ICD-10-CM | POA: Diagnosis not present

## 2022-03-28 DIAGNOSIS — F1410 Cocaine abuse, uncomplicated: Secondary | ICD-10-CM | POA: Diagnosis not present

## 2022-03-28 DIAGNOSIS — R636 Underweight: Secondary | ICD-10-CM | POA: Diagnosis present

## 2022-03-28 DIAGNOSIS — Z883 Allergy status to other anti-infective agents status: Secondary | ICD-10-CM

## 2022-03-28 DIAGNOSIS — Z1152 Encounter for screening for COVID-19: Secondary | ICD-10-CM

## 2022-03-28 DIAGNOSIS — R0602 Shortness of breath: Secondary | ICD-10-CM | POA: Diagnosis not present

## 2022-03-28 DIAGNOSIS — Z720 Tobacco use: Secondary | ICD-10-CM

## 2022-03-28 DIAGNOSIS — F17210 Nicotine dependence, cigarettes, uncomplicated: Secondary | ICD-10-CM | POA: Diagnosis present

## 2022-03-28 DIAGNOSIS — R9431 Abnormal electrocardiogram [ECG] [EKG]: Secondary | ICD-10-CM | POA: Diagnosis not present

## 2022-03-28 DIAGNOSIS — E669 Obesity, unspecified: Secondary | ICD-10-CM | POA: Diagnosis present

## 2022-03-28 DIAGNOSIS — Z681 Body mass index (BMI) 19 or less, adult: Secondary | ICD-10-CM

## 2022-03-28 DIAGNOSIS — I959 Hypotension, unspecified: Secondary | ICD-10-CM | POA: Diagnosis not present

## 2022-03-28 DIAGNOSIS — F419 Anxiety disorder, unspecified: Secondary | ICD-10-CM | POA: Diagnosis present

## 2022-03-28 DIAGNOSIS — D75839 Thrombocytosis, unspecified: Secondary | ICD-10-CM | POA: Diagnosis present

## 2022-03-28 HISTORY — DX: Acute respiratory failure with hypoxia: J96.01

## 2022-03-28 LAB — CBC WITH DIFFERENTIAL/PLATELET
Abs Immature Granulocytes: 0.04 10*3/uL (ref 0.00–0.07)
Basophils Absolute: 0.1 10*3/uL (ref 0.0–0.1)
Basophils Relative: 0 %
Eosinophils Absolute: 1.2 10*3/uL — ABNORMAL HIGH (ref 0.0–0.5)
Eosinophils Relative: 10 %
HCT: 41.7 % (ref 36.0–46.0)
Hemoglobin: 13.1 g/dL (ref 12.0–15.0)
Immature Granulocytes: 0 %
Lymphocytes Relative: 15 %
Lymphs Abs: 1.9 10*3/uL (ref 0.7–4.0)
MCH: 25.8 pg — ABNORMAL LOW (ref 26.0–34.0)
MCHC: 31.4 g/dL (ref 30.0–36.0)
MCV: 82.2 fL (ref 80.0–100.0)
Monocytes Absolute: 0.6 10*3/uL (ref 0.1–1.0)
Monocytes Relative: 4 %
Neutro Abs: 9.1 10*3/uL — ABNORMAL HIGH (ref 1.7–7.7)
Neutrophils Relative %: 71 %
Platelets: 476 10*3/uL — ABNORMAL HIGH (ref 150–400)
RBC: 5.07 MIL/uL (ref 3.87–5.11)
RDW: 15.2 % (ref 11.5–15.5)
WBC: 12.9 10*3/uL — ABNORMAL HIGH (ref 4.0–10.5)
nRBC: 0 % (ref 0.0–0.2)

## 2022-03-28 LAB — BASIC METABOLIC PANEL
Anion gap: 9 (ref 5–15)
BUN: 9 mg/dL (ref 6–20)
CO2: 24 mmol/L (ref 22–32)
Calcium: 8.4 mg/dL — ABNORMAL LOW (ref 8.9–10.3)
Chloride: 104 mmol/L (ref 98–111)
Creatinine, Ser: 0.66 mg/dL (ref 0.44–1.00)
GFR, Estimated: >60 mL/min (ref >60)
Glucose, Bld: 140 mg/dL — ABNORMAL HIGH (ref 70–99)
Potassium: 3.2 mmol/L — ABNORMAL LOW (ref 3.5–5.1)
Sodium: 137 mmol/L (ref 135–145)

## 2022-03-28 LAB — RESP PANEL BY RT-PCR (FLU A&B, COVID) ARPGX2
Influenza A by PCR: NEGATIVE
Influenza B by PCR: NEGATIVE
SARS Coronavirus 2 by RT PCR: NEGATIVE

## 2022-03-28 MED ORDER — HYDROCOD POLI-CHLORPHE POLI ER 10-8 MG/5ML PO SUER
5.0000 mL | Freq: Every evening | ORAL | Status: DC | PRN
  Administered 2022-03-29 – 2022-03-30 (×2): 5 mL via ORAL
  Filled 2022-03-28 (×3): qty 5

## 2022-03-28 MED ORDER — POTASSIUM CHLORIDE CRYS ER 20 MEQ PO TBCR
40.0000 meq | EXTENDED_RELEASE_TABLET | Freq: Once | ORAL | Status: AC
  Administered 2022-03-28: 40 meq via ORAL
  Filled 2022-03-28: qty 2

## 2022-03-28 MED ORDER — METHYLPREDNISOLONE SODIUM SUCC 40 MG IJ SOLR
40.0000 mg | Freq: Every day | INTRAMUSCULAR | Status: DC
  Administered 2022-03-29 – 2022-03-30 (×2): 40 mg via INTRAVENOUS
  Filled 2022-03-28 (×2): qty 1

## 2022-03-28 MED ORDER — ALBUTEROL SULFATE (2.5 MG/3ML) 0.083% IN NEBU
10.0000 mg/h | INHALATION_SOLUTION | Freq: Once | RESPIRATORY_TRACT | Status: AC
  Administered 2022-03-28: 10 mg/h via RESPIRATORY_TRACT
  Filled 2022-03-28: qty 12

## 2022-03-28 MED ORDER — LEVALBUTEROL HCL 1.25 MG/0.5ML IN NEBU
1.2500 mg | INHALATION_SOLUTION | Freq: Four times a day (QID) | RESPIRATORY_TRACT | Status: DC | PRN
  Administered 2022-03-30: 1.25 mg via RESPIRATORY_TRACT
  Filled 2022-03-28: qty 0.5

## 2022-03-28 MED ORDER — MAGNESIUM SULFATE 2 GM/50ML IV SOLN
2.0000 g | Freq: Once | INTRAVENOUS | Status: AC
  Administered 2022-03-28: 2 g via INTRAVENOUS
  Filled 2022-03-28: qty 50

## 2022-03-28 MED ORDER — ALBUTEROL SULFATE HFA 108 (90 BASE) MCG/ACT IN AERS: 2.0000 | INHALATION_SPRAY | RESPIRATORY_TRACT | Status: DC | PRN

## 2022-03-28 MED ORDER — HYDROXYZINE HCL 25 MG PO TABS
25.0000 mg | ORAL_TABLET | Freq: Once | ORAL | Status: AC
  Administered 2022-03-28: 25 mg via ORAL
  Filled 2022-03-28: qty 1

## 2022-03-28 NOTE — Assessment & Plan Note (Signed)
-  secondary to acute asthma exacerbation with O2 desaturation to 85% on room air requiring 2L -continue Xopenex nebulizer PRN q6hr due to asthma exacerbation  -daily IV solu medrol  -Tussionex for cough -wean O2 as tolerated with goal >92%

## 2022-03-28 NOTE — ED Triage Notes (Signed)
[facility] reports pt coming from hotel c/o sob. Pt wheezing upon EMS arrival room air 85%. EMS gave 10mg  Albuterol and of atrovent, 125mg  solumedrol. Has not had any of her meds in a few months.

## 2022-03-28 NOTE — Assessment & Plan Note (Signed)
-  mild at 3.2 -repleted with oral K in ED. Follow repeat in the morning.

## 2022-03-28 NOTE — ED Notes (Signed)
While ambulating in hall room air PT 02=91%     HR=143

## 2022-03-28 NOTE — H&P (Signed)
History and Physical    Patient: Rachel Stanley [id number] DOB: 25-Jun-1985 DOA: 03/28/2022 DOS: the patient was seen and examined on 03/28/2022 PCP: Patient, No Pcp Per  Patient coming from: Home  Chief Complaint:  Chief Complaint  Patient presents with   Shortness of Breath   HPI: Rachel Stanley is a 36 y.o. female with medical history significant of asthma and cocaine abuse who presents with increasing shortness of breath and cough for the past 2 week.  Has sore throat, frequent cough and increasing shortness of breath since 2 weeks ago. Though she had fevers at the start of her symptoms. Does not have any inhalers at home. Smokes 5-6 cigarettes daily. Also reports snorting cocaine about 2-3 days ago but intents to quit and reportedly has a bed waiting for her at a rehab.   She was found to be hypoxic down to 85% on room air by EMS and in the ED was tachycardic with heart rate of 133 and tachypneic with oxygen desaturation on ambulation down to 91%.  She was given DuoNeb, 125 mg Solu-Medrol en route by EMS.  In the ED she was given further albuterol treatment and IV magnesium.  Had leukocytosis of 12.9.  Mild hypokalemia of 3.2.  Chest x-rays with signs consistent with bronchitis.  Negative flu and COVID PCR.  Hospitalist consulted for continued management of acute hypoxia secondary to asthma exacerbation. Review of Systems: As mentioned in the history of present illness. All other systems reviewed and are negative. Past Medical History:  Diagnosis Date   Asthma    Medical history non-contributory    Past Surgical History:  Procedure Laterality Date   NO PAST SURGERIES     Social History:  reports that she has been smoking cigarettes. She has quit using smokeless tobacco. She reports that she does not currently use alcohol. She reports that she does not currently use drugs after having used the following drugs: Marijuana and "Crack" cocaine.  Allergies  Allergen Reactions    Nitrofurantoin Hives and Nausea And Vomiting   Penicillins Hives    States she was young but what she remembers is swelling and fever    Family History  Problem Relation Age of Onset   Heart disease Mother    Heart disease Father     Prior to Admission medications   Medication Sig Start Date End Date Taking? Authorizing Provider  ferrous sulfate 325 (65 FE) MG tablet Take 1 tablet (325 mg total) by mouth every other day. 12/19/18 12/19/19  FairMarin Shutter, MD  fluticasone (FLONASE) 50 MCG/ACT nasal spray Place 1-2 sprays into both nostrils daily for 7 days. 06/12/19 06/19/19  Wieters, Hallie C, PA-C  albuterol (PROVENTIL) (2.5 MG/3ML) 0.083% nebulizer solution Take 3 mLs (2.5 mg total) by nebulization every 6 (six) hours as needed for wheezing or shortness of breath. 06/12/19 11/17/19  Janith Lima, PA-C    Physical Exam: Vitals:   03/28/22 2108 03/28/22 2115 03/28/22 2145 03/28/22 2215  BP: 120/83 129/80 128/80 125/71  Pulse: (!) 102 (!) 101 (!) 107 95  Resp: 15 19 18 17   Temp: 98.2 F (36.8 C)     TempSrc:      SpO2: 97% 98% 98% 98%   Constitutional: NAD, calm, thin ill-appearing young female sitting upright in bed speaking in soft hoarse raspy voice Eyes:  lids and conjunctivae normal ENMT: Mucous membranes are moist.  Neck: normal, supple Respiratory: Diffuse expiratory wheezes.  Normal respiratory effort. No accessory muscle use.  Cardiovascular: Regular rate and rhythm, no murmurs / rubs / gallops. No extremity edema.  Abdomen: no tenderness,  Bowel sounds positive.  Musculoskeletal: no clubbing / cyanosis. No joint deformity upper and lower extremities. Good ROM, no contractures. Normal muscle tone.  Skin: no rashes, lesions, ulcers. Neurologic: CN 2-12 grossly intact.  Strength 5/5 in all 4.  Psychiatric: Normal judgment and insight. Alert and oriented x 3. Normal mood. Data Reviewed:  EKG in sinus tachycardia, no ST or T wave changes  Assessment and Plan: *  Acute respiratory failure with hypoxia (HCC) -secondary to acute asthma exacerbation with O2 desaturation to 85% on room air requiring 2L -continue Xopenex nebulizer PRN q6hr due to asthma exacerbation  -daily IV solu medrol  -Tussionex for cough -wean O2 as tolerated with goal >92%  Tobacco use -reports 5-6 cigarettes use daily. Declines nicotine patch here.Encouraged cessation.  Cocaine abuse (HCC) -reports last use about 2-3 days ago. Intents to quit and reportedly has rehab bed waiting for her. -no signs of active withdrawal. Continue to monitor.  Tachycardia -likely secondary to increase albuterol use and reactive to her hypoxia -On Xopenex to minimize side effects -keep on continuous telemetry -also has anxiety. Give one time dose hydroxyzine  Hypokalemia -mild at 3.2 -repleted with oral K in ED. Follow repeat in the morning.      Advance Care Planning: Full  Consults: none  Family Communication: none at bedside  Severity of Illness: The appropriate patient status for this patient is OBSERVATION. Observation status is judged to be reasonable and necessary in order to provide the required intensity of service to ensure the patient's safety. The patient's presenting symptoms, physical exam findings, and initial radiographic and laboratory data in the context of their medical condition is felt to place them at decreased risk for further clinical deterioration. Furthermore, it is anticipated that the patient will be medically stable for discharge from the hospital within 2 midnights of admission.   Author: Anselm Jungling, DO 03/28/2022 10:22 PM  For on call review [email or web address].

## 2022-03-28 NOTE — ED Notes (Signed)
Pt mother asked for an update. RN stated she was getting admitted.

## 2022-03-28 NOTE — ED Provider Notes (Signed)
[facility]-EMERGENCY DEPT Provider Note   CSN: [id number] Arrival date & time: 03/28/22  1703     History  Chief Complaint  Patient presents with   Shortness of Breath    Rachel Stanley is a 36 y.o. female.  The history is provided by the patient, the EMS personnel and medical records. No language interpreter was used.  Shortness of Breath    36 year old female significant history of asthma but not on any medication brought here via EMS from a hotel with complaints of shortness of breath.  Patient report for the past 10 days she has had progressive worsening shortness of breath and wheezing along with cold symptoms.  Symptoms got much worse today prompting EMS to bring her here.  She endorsed cough productive with white phlegm, shortness of breath both at rest and with exertion.  She does not endorse fever or chills no vomiting or diarrhea.  She admits to crack use last use was yesterday.  She denies alcohol use and denies IV drug use.  She does not have any of her regular home medication.  States she has not had it for quite a while.  EMS initially note O2 sats to be at 85% on room air and patient did receive 10 mg of albuterol and 500 mcg of Atrovent along with 125 mg of Solu-Medrol prior to arrival.  Patient reports some improvement of her symptoms.  Home Medications Prior to Admission medications   Medication Sig Start Date End Date Taking? Authorizing Provider  ferrous sulfate 325 (65 FE) MG tablet Take 1 tablet (325 mg total) by mouth every other day. 12/19/18 12/19/19  FairHoyle Sauer, MD  fluticasone (FLONASE) 50 MCG/ACT nasal spray Place 1-2 sprays into both nostrils daily for 7 days. 06/12/19 06/19/19  Wieters, Hallie C, PA-C  albuterol (PROVENTIL) (2.5 MG/3ML) 0.083% nebulizer solution Take 3 mLs (2.5 mg total) by nebulization every 6 (six) hours as needed for wheezing or shortness of breath. 06/12/19 11/17/19  Wieters, Hallie C, PA-C      Allergies     Macrobid [nitrofurantoin] and Penicillins    Review of Systems   Review of Systems  Respiratory:  Positive for shortness of breath.   All other systems reviewed and are negative.   Physical Exam Updated Vital Signs BP (!) 171/119 (BP Location: Right Arm)   Pulse (!) 133   Temp 98.6 F (37 C) (Oral)   Resp (!) 26   SpO2 96%  Physical Exam Vitals and nursing note reviewed.  Constitutional:      Appearance: She is well-developed.     Comments: Frail-appearing female, sitting upright, in moderate respiratory discomfort.  She has audible wheezing, with sternal retraction  HENT:     Head: Atraumatic.  Eyes:     Conjunctiva/sclera: Conjunctivae normal.  Cardiovascular:     Rate and Rhythm: Tachycardia present.  Pulmonary:     Effort: Tachypnea and respiratory distress present.     Breath sounds: Decreased breath sounds and wheezing present. No rhonchi or rales.  Chest:     Chest wall: No tenderness.  Musculoskeletal:     Cervical back: Neck supple.     Right lower leg: No edema.     Left lower leg: No edema.  Skin:    Findings: No rash.  Neurological:     Mental Status: She is alert.  Psychiatric:        Mood and Affect: Mood normal.     ED Results / Procedures /  Treatments   Labs (all labs ordered are listed, but only abnormal results are displayed) Labs Reviewed  BASIC METABOLIC PANEL - Abnormal; Notable for the following components:      Result Value   Potassium 3.2 (*)    Glucose, Bld 140 (*)    Calcium 8.4 (*)    All other components within normal limits  CBC WITH DIFFERENTIAL/PLATELET - Abnormal; Notable for the following components:   WBC 12.9 (*)    MCH 25.8 (*)    Platelets 476 (*)    Neutro Abs 9.1 (*)    Eosinophils Absolute 1.2 (*)    All other components within normal limits  RESP PANEL BY RT-PCR (FLU A&B, COVID) ARPGX2  POC URINE PREG, ED    EKG None ED ECG REPORT   Date: 03/28/2022  Rate: 118  Rhythm: sinus tachycardia  QRS Axis:  normal  Intervals: normal  ST/T Wave abnormalities: nonspecific ST changes  Conduction Disutrbances:none  Narrative Interpretation:   Old EKG Reviewed: unchanged  I have personally reviewed the EKG tracing and agree with the computerized printout as noted.   Radiology DG Chest 2 View  Result Date: 03/28/2022 CLINICAL DATA:  Short of breath EXAM: CHEST - 2 VIEW COMPARISON:  Prior chest x-ray 03/22/2022 FINDINGS: The lungs are clear and negative for focal airspace consolidation, pulmonary edema or suspicious pulmonary nodule. No pleural effusion or pneumothorax. Diffuse mild peribronchial cuffing in a perihilar and bibasilar distribution. Cardiac and mediastinal contours are within normal limits. No acute fracture or lytic or blastic osseous lesions. The visualized upper abdominal bowel gas pattern is unremarkable. IMPRESSION: Diffuse mild peribronchial cuffing in a perihilar and bibasilar distribution. Differential considerations include acute versus chronic bronchitis. Findings are similar compared to relatively recent prior imaging. Electronically Signed   By: Malachy Moan M.D.   On: 03/28/2022 17:36    Procedures .Critical Care  Performed by: Fayrene Helper, PA-C Authorized by: Fayrene Helper, PA-C   Critical care provider statement:    Critical care time (minutes):  35   Critical care was time spent personally by me on the following activities:  Development of treatment plan with patient or surrogate, discussions with consultants, evaluation of patient's response to treatment, examination of patient, ordering and review of laboratory studies, ordering and review of radiographic studies, ordering and performing treatments and interventions, pulse oximetry, re-evaluation of patient's condition and review of old charts     Medications Ordered in ED Medications  potassium chloride SA (KLOR-CON M) CR tablet 40 mEq (has no administration in time range)  albuterol (VENTOLIN HFA) 108 (90 Base)  MCG/ACT inhaler 2 puff (has no administration in time range)  albuterol (PROVENTIL) (2.5 MG/3ML) 0.083% nebulizer solution (10 mg/hr Nebulization Given 03/28/22 1733)  magnesium sulfate IVPB 2 g 50 mL (0 g Intravenous Stopped 03/28/22 1843)    ED Course/ Medical Decision Making/ A&P                           Medical Decision Making Amount and/or Complexity of Data Reviewed Labs: ordered. Radiology: ordered.  Risk Prescription drug management.   BP (!) 171/119 (BP Location: Right Arm)   Pulse (!) 133   Temp 98.6 F (37 C) (Oral)   Resp (!) 26   SpO2 96%   89:47 PM  37 year old female significant history of asthma but not on any medication brought here via EMS from a hotel with complaints of shortness of breath.  Patient report for  the past 10 days she has had progressive worsening shortness of breath and wheezing along with cold symptoms.  Symptoms got much worse today prompting EMS to bring her here.  She endorsed cough productive with white phlegm, shortness of breath both at rest and with exertion.  She does not endorse fever or chills no vomiting or diarrhea.  She admits to crack use last use was yesterday.  She denies alcohol use and denies IV drug use.  She does not have any of her regular home medication.  States she has not had it for quite a while.  EMS initially note O2 sats to be at 85% on room air and patient did receive 10 mg of albuterol and 500 mcg of Atrovent along with 125 mg of Solu-Medrol prior to arrival.  Patient reports some improvement of her symptoms.  On exam this is a small statured patient appears slightly older than his stated age, in moderate respiratory discomfort.  She has audible wheezing, with sternal retraction.  She has diminished breath sounds with expiratory wheezes heard.  She does not appears to be fluid overloaded.  She will benefit from further respiratory care including continued nebulizer.  Will place patient on wheeze protocol.  Workup  initiated.  -Labs ordered, independently viewed and interpreted by me.  Labs remarkable for mildly elevated WBC of 12.9, which may reflect underlying infection.  K+ 3.2, supplementation given. Covid and Flu test negative -The patient was maintained on a cardiac monitor.  I personally viewed and interpreted the cardiac monitored which showed an underlying rhythm of: sinus tachycardia -Imaging independently viewed and interpreted by me and I agree with radiologist's interpretation.  Result remarkable for CXR showing bronchitis -This patient presents to the ED for concern of sob, this involves an extensive number of treatment options, and is a complaint that carries with it a high risk of complications and morbidity.  The differential diagnosis includes bronchitis, pna, covid, flu, pe, asthma exacerbation, copd -Co morbidities that complicate the patient evaluation includes asthma -Treatment includes albuterol, atrovent, mag, potassium -Reevaluation of the patient after these medicines showed that the patient improved -PCP office notes or outside notes reviewed -Escalation to admission/observation considered: patients feels mildly better but not comfortable going home.  Pt ambulated in the hall on room air satting at 91% and HR 143.  Will consult for admission -Social Determinant of Health considered which includes crack use, recommend cessation  8:44 PM Workup Notable for elevated white count of 12.9 likely reflecting respiratory infection.  Potassium is 3.2, supplementation given.  Patient had negative COVID and flu test.  Chest x-ray without signs of pneumonia however it does shows evidence concerning for bronchitis which supported her presentation today.  Patient received IV fluid here, continuous breathing treatment, and on reassessment patient does feel better with more clear lung sounds.  Patient is tachycardic likely secondary to recent albuterol use.  I have considered PE was felt less likely.  I  recommend patient to avoid recreational drug use such as crack cocaine.  Unfortunately when ambulating pt O2 at 91% on RA and HR 143.  She was symptomatic.  Will consult for admission.   9:44 PM Appreciate consultation from [facility] hospitalist, Dr. Cyndia Bent who agrees to see and will admit patient for further management of her hypoxia in the setting of acute bronchitis leading to asthma exacerbation.        Final Clinical Impression(s) / ED Diagnoses Final diagnoses:  Acute respiratory failure with hypoxemia (HCC)  Bronchitis  Rx / DC Orders ED Discharge Orders     None         Fayrene Helperran, Jalia Zuniga, Cordelia Poche-C 03/28/22 2145    Bethann BerkshireZammit, Joseph, MD 03/30/22 [phone]

## 2022-03-28 NOTE — Assessment & Plan Note (Signed)
-  likely secondary to increase albuterol use and reactive to her hypoxia -On Xopenex to minimize side effects -keep on continuous telemetry -also has anxiety. Give one time dose hydroxyzine

## 2022-03-28 NOTE — Assessment & Plan Note (Signed)
-  reports last use about 2-3 days ago. Intents to quit and reportedly has rehab bed waiting for her. -no signs of active withdrawal. Continue to monitor.

## 2022-03-28 NOTE — Assessment & Plan Note (Signed)
-  reports 5-6 cigarettes use daily. Declines nicotine patch here.Encouraged cessation.

## 2022-03-29 DIAGNOSIS — J189 Pneumonia, unspecified organism: Secondary | ICD-10-CM | POA: Diagnosis present

## 2022-03-29 DIAGNOSIS — D75839 Thrombocytosis, unspecified: Secondary | ICD-10-CM | POA: Diagnosis present

## 2022-03-29 DIAGNOSIS — Z883 Allergy status to other anti-infective agents status: Secondary | ICD-10-CM | POA: Diagnosis not present

## 2022-03-29 DIAGNOSIS — E669 Obesity, unspecified: Secondary | ICD-10-CM | POA: Diagnosis present

## 2022-03-29 DIAGNOSIS — F1410 Cocaine abuse, uncomplicated: Secondary | ICD-10-CM | POA: Diagnosis present

## 2022-03-29 DIAGNOSIS — E876 Hypokalemia: Secondary | ICD-10-CM | POA: Diagnosis present

## 2022-03-29 DIAGNOSIS — R636 Underweight: Secondary | ICD-10-CM | POA: Diagnosis present

## 2022-03-29 DIAGNOSIS — Z5329 Procedure and treatment not carried out because of patient's decision for other reasons: Secondary | ICD-10-CM | POA: Diagnosis present

## 2022-03-29 DIAGNOSIS — J209 Acute bronchitis, unspecified: Secondary | ICD-10-CM | POA: Diagnosis present

## 2022-03-29 DIAGNOSIS — R0602 Shortness of breath: Secondary | ICD-10-CM | POA: Diagnosis not present

## 2022-03-29 DIAGNOSIS — J45901 Unspecified asthma with (acute) exacerbation: Secondary | ICD-10-CM | POA: Diagnosis not present

## 2022-03-29 DIAGNOSIS — Z1152 Encounter for screening for COVID-19: Secondary | ICD-10-CM | POA: Diagnosis not present

## 2022-03-29 DIAGNOSIS — Z880 Allergy status to penicillin: Secondary | ICD-10-CM | POA: Diagnosis not present

## 2022-03-29 DIAGNOSIS — J9601 Acute respiratory failure with hypoxia: Secondary | ICD-10-CM | POA: Diagnosis not present

## 2022-03-29 DIAGNOSIS — Z681 Body mass index (BMI) 19 or less, adult: Secondary | ICD-10-CM | POA: Diagnosis not present

## 2022-03-29 DIAGNOSIS — J40 Bronchitis, not specified as acute or chronic: Secondary | ICD-10-CM | POA: Diagnosis not present

## 2022-03-29 DIAGNOSIS — J4521 Mild intermittent asthma with (acute) exacerbation: Secondary | ICD-10-CM | POA: Diagnosis present

## 2022-03-29 DIAGNOSIS — F17210 Nicotine dependence, cigarettes, uncomplicated: Secondary | ICD-10-CM | POA: Diagnosis present

## 2022-03-29 DIAGNOSIS — Z6835 Body mass index (BMI) 35.0-35.9, adult: Secondary | ICD-10-CM | POA: Diagnosis not present

## 2022-03-29 DIAGNOSIS — F419 Anxiety disorder, unspecified: Secondary | ICD-10-CM | POA: Diagnosis present

## 2022-03-29 LAB — BASIC METABOLIC PANEL
Anion gap: 10 (ref 5–15)
BUN: 12 mg/dL (ref 6–20)
CO2: 24 mmol/L (ref 22–32)
Calcium: 8.9 mg/dL (ref 8.9–10.3)
Chloride: 103 mmol/L (ref 98–111)
Creatinine, Ser: 0.68 mg/dL (ref 0.44–1.00)
GFR, Estimated: >60 mL/min (ref >60)
Glucose, Bld: 137 mg/dL — ABNORMAL HIGH (ref 70–99)
Potassium: 4.3 mmol/L (ref 3.5–5.1)
Sodium: 137 mmol/L (ref 135–145)

## 2022-03-29 LAB — RESPIRATORY PANEL BY PCR

## 2022-03-29 LAB — CBC
HCT: 38.3 % (ref 36.0–46.0)
Hemoglobin: 12.1 g/dL (ref 12.0–15.0)
MCH: 25.8 pg — ABNORMAL LOW (ref 26.0–34.0)
MCHC: 31.6 g/dL (ref 30.0–36.0)
MCV: 81.7 fL (ref 80.0–100.0)
Platelets: 468 10*3/uL — ABNORMAL HIGH (ref 150–400)
RBC: 4.69 MIL/uL (ref 3.87–5.11)
RDW: 15.2 % (ref 11.5–15.5)
WBC: 9.6 10*3/uL (ref 4.0–10.5)
nRBC: 0 % (ref 0.0–0.2)

## 2022-03-29 LAB — RAPID URINE DRUG SCREEN, HOSP PERFORMED
Amphetamines: NOT DETECTED
Barbiturates: NOT DETECTED
Benzodiazepines: NOT DETECTED
Cocaine: POSITIVE — AB
Opiates: NOT DETECTED
Tetrahydrocannabinol: NOT DETECTED

## 2022-03-29 LAB — HIV ANTIBODY (ROUTINE TESTING W REFLEX): HIV Screen 4th Generation wRfx: NONREACTIVE

## 2022-03-29 LAB — POC URINE PREG, ED: Preg Test, Ur: NEGATIVE

## 2022-03-29 MED ORDER — AZITHROMYCIN 250 MG PO TABS
500.0000 mg | ORAL_TABLET | Freq: Every day | ORAL | Status: DC
  Administered 2022-03-29 – 2022-03-31 (×3): 500 mg via ORAL
  Filled 2022-03-29 (×3): qty 2

## 2022-03-29 MED ORDER — GUAIFENESIN ER 600 MG PO TB12
1200.0000 mg | ORAL_TABLET | Freq: Two times a day (BID) | ORAL | Status: DC
  Administered 2022-03-29 – 2022-03-31 (×5): 1200 mg via ORAL
  Filled 2022-03-29 (×5): qty 2

## 2022-03-29 MED ORDER — IPRATROPIUM BROMIDE 0.02 % IN SOLN
0.5000 mg | Freq: Four times a day (QID) | RESPIRATORY_TRACT | Status: DC
  Administered 2022-03-29 – 2022-03-30 (×6): 0.5 mg via RESPIRATORY_TRACT
  Filled 2022-03-29 (×6): qty 2.5

## 2022-03-29 MED ORDER — LEVALBUTEROL HCL 0.63 MG/3ML IN NEBU
0.6300 mg | INHALATION_SOLUTION | Freq: Four times a day (QID) | RESPIRATORY_TRACT | Status: DC
  Administered 2022-03-29 – 2022-03-30 (×6): 0.63 mg via RESPIRATORY_TRACT
  Filled 2022-03-29 (×6): qty 3

## 2022-03-29 NOTE — Progress Notes (Signed)
Duo to coughing unable to attempt Peak Flow with first Q6 treatment.

## 2022-03-29 NOTE — Progress Notes (Signed)
PROGRESS NOTE    Rachel Stanley  AOZ:[id number] DOB: 03-18-1986 DOA: 03/28/2022 PCP: Patient, No Pcp Per   Brief Narrative:  The patient is a 36 year old African-American female with a past medical history significant for but not limited to asthma cocaine abuse as well as other comorbidities who presents with increasing worsening shortness of breath and cough last 2 weeks.  She admits to having a sore throat as well as a frequent cough as well as increasing shortness of breath.  She thought she had fevers at the time when she her symptoms started and does not have any inhalers at home.  She continues to smoke 5 to 6 cigarettes daily and also reported snorting cocaine about 2-3 days ago but intends to quit and reportedly has a bed waiting for repair.  When EMS arrived she is found to be hypoxic on room air at 85% and was brought to the ED where she was tachycardic with a heart rate of 133 and she desaturated on ambulation.  She is given DuoNeb, 125 mg of Solu-Medrol en route by EMS and was given further albuterol treatment and IV magnesium in the ED.  She had a mild leukocytosis 12.9 and a mild hypokalemia.  Chest x-ray showed signs consistent with bronchitis but flu and COVID PCR were negative.  The hospitalist team was consulted for continued management acute hypoxia secondary to asthma exacerbation acute bronchitis.  Assessment and Plan: * Acute respiratory failure with hypoxia (HCC) -Secondary to acute asthma exacerbation with O2 desaturation to 85% on room air requiring 2L -SpO2: 98 % O2 Flow Rate (L/min): 2 L/min (to 1 lpm post neb) -Continue supplemental oxygen via nasal cannula and wean O2 as tolerated -Continuous pulse oximetry maintain O2 saturation greater than 90% -continue Xopenex nebulizer PRN q6hr due to asthma exacerbation; will start scheduled Xopenex and Atrovent every 6 -Continue with daily systemic steroids with IV Solu-Medrol 40 g daily as she received 125 mg prior to arriving  to the ED -Tussionex 5 mL p.o. nightly as as needed for cough -Checked a respiratory panel via PCR and this was negative -Will start Guaifenesin 1200 mg p.o. twice daily, incentive spirometry, flutter valve and a peak flow meter -Will repeat a chest x-ray in the a.m. and she will need an Ambulatory home O2 screen prior to discharge -Will start her on Azithromycin 500 mg po Daily   Tobacco Use -Reports 5-6 cigarettes use daily.  -Declines nicotine patch here -Encouraged cessation.  Cocaine Abuse (HCC) -Reports last use about 2-3 days ago.  -Intents to quit and reportedly has rehab bed waiting for her. -UDS + -No signs of active withdrawal. Continue to monitor.  Tachycardia, improving  -likely secondary to increase albuterol use and reactive to her hypoxia -On Xopenex to minimize side effects -Continue to Keep on continuous telemetry -Also has anxiety. Give one time dose hydroxyzine  Hypokalemia -Mild at 3.2 -Replete with oral K in ED and improved -Check Mag Level in the AM  -Continue to Monitor and Replete as Necessary -Repeat CMP in the AM   Obesity -Complicates overall prognosis and care -Estimated body mass index is 35.45 kg/m as calculated from the following:   Height as of 12/11/19: 5\' 5"  (1.651 m).   Weight as of 12/11/19: 96.6 kg.  -Weight Loss and Dietary Counseling given  Thrombocytosis -Mild and Likely Reactive and patient's Platelet Count went from 537 -> 476 -> 468  -Continue to Monitor and Trend and repeat CBC in the AM   DVT  prophylaxis: SCDs Start: 03/28/22 2226    Code Status: Full Code Family Communication: No family present at bedside   Disposition Plan:  Level of care: Telemetry Status is: Observation The patient will require care spanning > 2 midnights and should be moved to inpatient because: Continues to be hypoxic and will need an ambulatory home O2 screen and repeat chest x-ray and further clinical improvement in her respiratory status    Consultants:  None  Procedures:  None  Antimicrobials:  Anti-infectives (From admission, onward)    None       Subjective: Seen and examined at bedside and thinks her respiratory status is doing little bit better but still remains on supplemental oxygen.  Still feels short of breath but feels much better than when she came in..  Denies any chest pain but states that she has some abdominal pain when she coughs.  No other concerns or complaints this time.  Objective: Vitals:   03/29/22 0415 03/29/22 0430 03/29/22 0546 03/29/22 0730  BP: 122/72 112/71  124/76  Pulse: 86 86  81  Resp: 17 14  18   Temp:   97.9 F (36.6 C)   TempSrc:   Oral   SpO2: 98% 98%  100%   No intake or output data in the 24 hours ending 03/29/22 0958 There were no vitals filed for this visit.  Examination: Physical Exam:  Constitutional: WN/WD obese Caucasian female currently no acute distress Respiratory: Diminished to auscultation bilaterally with coarse breath sounds and some expiratory wheezing worse on the right compared to left but no appreciable rhonchi or crackles. Normal respiratory effort and patient is not tachypenic. No accessory muscle use.  Unlabored breathing and is wearing supplemental oxygen via nasal cannula Cardiovascular: RRR, no murmurs / rubs / gallops. S1 and S2 auscultated.  Trace extremity edema Abdomen: Soft, non-tender, distended secondary body habitus. Bowel sounds positive.  GU: Deferred. Musculoskeletal: No clubbing / cyanosis of digits/nails. No joint deformity upper and lower extremities.  Skin: No rashes, lesions, ulcers. No induration; Warm and dry.  Neurologic: CN 2-12 grossly intact with no focal deficits. Romberg sign and cerebellar reflexes not assessed.  Psychiatric: Normal judgment and insight. Alert and oriented x 3. Normal mood and appropriate affect.   Data Reviewed: I have personally reviewed following labs and imaging studies  CBC: Recent Labs  Lab  03/22/22 2230 03/28/22 1729 03/29/22 0245  WBC 9.9 12.9* 9.6  NEUTROABS 5.4 9.1*  --   HGB 13.1 13.1 12.1  HCT 41.2 41.7 38.3  MCV 81.9 82.2 81.7  PLT 537* 476* [id number]*   Basic Metabolic Panel: Recent Labs  Lab 03/22/22 2230 03/28/22 1729 03/29/22 0245  NA 138 137 137  K 4.0 3.2* 4.3  CL 102 104 103  CO2 24 24 24   GLUCOSE 101* 140* 137*  BUN 9 9 12   CREATININE 0.75 0.66 0.68  CALCIUM 9.1 8.4* 8.9   GFR: CrCl cannot be calculated (Unknown ideal weight.). Liver Function Tests: No results for input(s): "AST", "ALT", "ALKPHOS", "BILITOT", "PROT", "ALBUMIN" in the last 168 hours. No results for input(s): "LIPASE", "AMYLASE" in the last 168 hours. No results for input(s): "AMMONIA" in the last 168 hours. Coagulation Profile: No results for input(s): "INR", "PROTIME" in the last 168 hours. Cardiac Enzymes: No results for input(s): "CKTOTAL", "CKMB", "CKMBINDEX", "TROPONINI" in the last 168 hours. BNP (last 3 results) No results for input(s): "PROBNP" in the last 8760 hours. HbA1C: No results for input(s): "HGBA1C" in the last 72 hours. CBG: No  results for input(s): "GLUCAP" in the last 168 hours. Lipid Profile: No results for input(s): "CHOL", "HDL", "LDLCALC", "TRIG", "CHOLHDL", "LDLDIRECT" in the last 72 hours. Thyroid Function Tests: No results for input(s): "TSH", "T4TOTAL", "FREET4", "T3FREE", "THYROIDAB" in the last 72 hours. Anemia Panel: No results for input(s): "VITAMINB12", "FOLATE", "FERRITIN", "TIBC", "IRON", "RETICCTPCT" in the last 72 hours. Sepsis Labs: No results for input(s): "PROCALCITON", "LATICACIDVEN" in the last 168 hours.  Recent Results (from the past 240 hour(s))  Resp Panel by RT-PCR (Flu A&B, Covid) Anterior Nasal Swab     Status: None   Collection Time: 03/22/22 10:30 PM   Specimen: Anterior Nasal Swab  Result Value Ref Range Status   SARS Coronavirus 2 by RT PCR NEGATIVE NEGATIVE Final    Comment: (NOTE) SARS-CoV-2 target nucleic acids are  NOT DETECTED.  The SARS-CoV-2 RNA is generally detectable in upper respiratory specimens during the acute phase of infection. The lowest concentration of SARS-CoV-2 viral copies this assay can detect is 138 copies/mL. A negative result does not preclude SARS-Cov-2 infection and should not be used as the sole basis for treatment or other patient management decisions. A negative result may occur with  improper specimen collection/handling, submission of specimen other than nasopharyngeal swab, presence of viral mutation(s) within the areas targeted by this assay, and inadequate number of viral copies(<138 copies/mL). A negative result must be combined with clinical observations, patient history, and epidemiological information. The expected result is Negative.  Fact Sheet for Patients:  EntrepreneurPulse.com.au  Fact Sheet for Healthcare Providers:  IncredibleEmployment.be  This test is no t yet approved or cleared by the [place] FDA and  has been authorized for detection and/or diagnosis of SARS-CoV-2 by FDA under an Emergency Use Authorization (EUA). This EUA will remain  in effect (meaning this test can be used) for the duration of the COVID-19 declaration under Section 564(b)(1) of the Act, 21 U.S.C.section 360bbb-3(b)(1), unless the authorization is terminated  or revoked sooner.       Influenza A by PCR NEGATIVE NEGATIVE Final   Influenza B by PCR NEGATIVE NEGATIVE Final    Comment: (NOTE) The Xpert Xpress SARS-CoV-2/FLU/RSV plus assay is intended as an aid in the diagnosis of influenza from Nasopharyngeal swab specimens and should not be used as a sole basis for treatment. Nasal washings and aspirates are unacceptable for Xpert Xpress SARS-CoV-2/FLU/RSV testing.  Fact Sheet for Patients: EntrepreneurPulse.com.au  Fact Sheet for Healthcare Providers: IncredibleEmployment.be  This test is not  yet approved or cleared by the [place] FDA and has been authorized for detection and/or diagnosis of SARS-CoV-2 by FDA under an Emergency Use Authorization (EUA). This EUA will remain in effect (meaning this test can be used) for the duration of the COVID-19 declaration under Section 564(b)(1) of the Act, 21 U.S.C. section 360bbb-3(b)(1), unless the authorization is terminated or revoked.  Performed at [facility], [place] [address] [place], [place] [address]   Resp Panel by RT-PCR (Flu A&B, Covid) Anterior Nasal Swab     Status: None   Collection Time: 03/28/22  5:24 PM   Specimen: Anterior Nasal Swab  Result Value Ref Range Status   SARS Coronavirus 2 by RT PCR NEGATIVE NEGATIVE Final    Comment: (NOTE) SARS-CoV-2 target nucleic acids are NOT DETECTED.  The SARS-CoV-2 RNA is generally detectable in upper respiratory specimens during the acute phase of infection. The lowest concentration of SARS-CoV-2 viral copies this assay can detect is 138 copies/mL. A negative result does not preclude  SARS-Cov-2 infection and should not be used as the sole basis for treatment or other patient management decisions. A negative result may occur with  improper specimen collection/handling, submission of specimen other than nasopharyngeal swab, presence of viral mutation(s) within the areas targeted by this assay, and inadequate number of viral copies(<138 copies/mL). A negative result must be combined with clinical observations, patient history, and epidemiological information. The expected result is Negative.  Fact Sheet for Patients:  EntrepreneurPulse.com.au  Fact Sheet for Healthcare Providers:  IncredibleEmployment.be  This test is no t yet approved or cleared by the [place] FDA and  has been authorized for detection and/or diagnosis of SARS-CoV-2 by FDA under an Emergency Use Authorization (EUA). This EUA will remain  in effect  (meaning this test can be used) for the duration of the COVID-19 declaration under Section 564(b)(1) of the Act, 21 U.S.C.section 360bbb-3(b)(1), unless the authorization is terminated  or revoked sooner.       Influenza A by PCR NEGATIVE NEGATIVE Final   Influenza B by PCR NEGATIVE NEGATIVE Final    Comment: (NOTE) The Xpert Xpress SARS-CoV-2/FLU/RSV plus assay is intended as an aid in the diagnosis of influenza from Nasopharyngeal swab specimens and should not be used as a sole basis for treatment. Nasal washings and aspirates are unacceptable for Xpert Xpress SARS-CoV-2/FLU/RSV testing.  Fact Sheet for Patients: EntrepreneurPulse.com.au  Fact Sheet for Healthcare Providers: IncredibleEmployment.be  This test is not yet approved or cleared by the [place] FDA and has been authorized for detection and/or diagnosis of SARS-CoV-2 by FDA under an Emergency Use Authorization (EUA). This EUA will remain in effect (meaning this test can be used) for the duration of the COVID-19 declaration under Section 564(b)(1) of the Act, 21 U.S.C. section 360bbb-3(b)(1), unless the authorization is terminated or revoked.  Performed at [facility], [place] [address], [place], [place] [address]     Radiology Studies: DG Chest 2 View  Result Date: 03/28/2022 CLINICAL DATA:  Short of breath EXAM: CHEST - 2 VIEW COMPARISON:  Prior chest x-ray 03/22/2022 FINDINGS: The lungs are clear and negative for focal airspace consolidation, pulmonary edema or suspicious pulmonary nodule. No pleural effusion or pneumothorax. Diffuse mild peribronchial cuffing in a perihilar and bibasilar distribution. Cardiac and mediastinal contours are within normal limits. No acute fracture or lytic or blastic osseous lesions. The visualized upper abdominal bowel gas pattern is unremarkable. IMPRESSION: Diffuse mild peribronchial cuffing in a perihilar and bibasilar  distribution. Differential considerations include acute versus chronic bronchitis. Findings are similar compared to relatively recent prior imaging. Electronically Signed   By: Jacqulynn Cadet M.D.   On: 03/28/2022 17:36    Scheduled Meds:  guaiFENesin  1,200 mg Oral BID   ipratropium  0.5 mg Nebulization Q6H   levalbuterol  0.63 mg Nebulization Q6H   methylPREDNISolone (SOLU-MEDROL) injection  40 mg Intravenous Daily   Continuous Infusions:   LOS: 0 days   Raiford Noble, DO [facility] Available via Epic secure chat 7am-7pm After these hours, please refer to coverage provider listed on [email or web address] 03/29/2022, 9:58 AM

## 2022-03-30 ENCOUNTER — Other Ambulatory Visit: Payer: Self-pay

## 2022-03-30 ENCOUNTER — Inpatient Hospital Stay (HOSPITAL_COMMUNITY): Payer: Medicaid Other

## 2022-03-30 DIAGNOSIS — J45901 Unspecified asthma with (acute) exacerbation: Secondary | ICD-10-CM | POA: Diagnosis not present

## 2022-03-30 DIAGNOSIS — J9601 Acute respiratory failure with hypoxia: Secondary | ICD-10-CM | POA: Diagnosis not present

## 2022-03-30 DIAGNOSIS — E876 Hypokalemia: Secondary | ICD-10-CM | POA: Diagnosis not present

## 2022-03-30 DIAGNOSIS — F1410 Cocaine abuse, uncomplicated: Secondary | ICD-10-CM | POA: Diagnosis not present

## 2022-03-30 LAB — COMPREHENSIVE METABOLIC PANEL
ALT: 24 U/L (ref 0–44)
AST: 26 U/L (ref 15–41)
Albumin: 2.7 g/dL — ABNORMAL LOW (ref 3.5–5.0)
Alkaline Phosphatase: 66 U/L (ref 38–126)
Anion gap: 5 (ref 5–15)
BUN: 19 mg/dL (ref 6–20)
CO2: 28 mmol/L (ref 22–32)
Calcium: 8.5 mg/dL — ABNORMAL LOW (ref 8.9–10.3)
Chloride: 104 mmol/L (ref 98–111)
Creatinine, Ser: 0.74 mg/dL (ref 0.44–1.00)
GFR, Estimated: >60 mL/min (ref >60)
Glucose, Bld: 93 mg/dL (ref 70–99)
Potassium: 4.1 mmol/L (ref 3.5–5.1)
Sodium: 137 mmol/L (ref 135–145)
Total Bilirubin: 0.2 mg/dL — ABNORMAL LOW (ref 0.3–1.2)
Total Protein: 7.4 g/dL (ref 6.5–8.1)

## 2022-03-30 LAB — CBC WITH DIFFERENTIAL/PLATELET
Abs Immature Granulocytes: 0.04 10*3/uL (ref 0.00–0.07)
Basophils Absolute: 0.1 10*3/uL (ref 0.0–0.1)
Basophils Relative: 1 %
Eosinophils Absolute: 0.5 10*3/uL (ref 0.0–0.5)
Eosinophils Relative: 4 %
HCT: 37.4 % (ref 36.0–46.0)
Hemoglobin: 11.8 g/dL — ABNORMAL LOW (ref 12.0–15.0)
Immature Granulocytes: 0 %
Lymphocytes Relative: 25 %
Lymphs Abs: 3.4 10*3/uL (ref 0.7–4.0)
MCH: 26.2 pg (ref 26.0–34.0)
MCHC: 31.6 g/dL (ref 30.0–36.0)
MCV: 83.1 fL (ref 80.0–100.0)
Monocytes Absolute: 0.9 10*3/uL (ref 0.1–1.0)
Monocytes Relative: 7 %
Neutro Abs: 8.9 10*3/uL — ABNORMAL HIGH (ref 1.7–7.7)
Neutrophils Relative %: 63 %
Platelets: 426 10*3/uL — ABNORMAL HIGH (ref 150–400)
RBC: 4.5 MIL/uL (ref 3.87–5.11)
RDW: 15.6 % — ABNORMAL HIGH (ref 11.5–15.5)
WBC: 13.8 10*3/uL — ABNORMAL HIGH (ref 4.0–10.5)
nRBC: 0 % (ref 0.0–0.2)

## 2022-03-30 LAB — PHOSPHORUS: Phosphorus: 3.8 mg/dL (ref 2.5–4.6)

## 2022-03-30 LAB — MAGNESIUM: Magnesium: 1.9 mg/dL (ref 1.7–2.4)

## 2022-03-30 MED ORDER — BUDESONIDE 0.25 MG/2ML IN SUSP
0.2500 mg | Freq: Two times a day (BID) | RESPIRATORY_TRACT | Status: DC
  Administered 2022-03-30 – 2022-03-31 (×2): 0.25 mg via RESPIRATORY_TRACT
  Filled 2022-03-30 (×3): qty 2

## 2022-03-30 MED ORDER — ARFORMOTEROL TARTRATE 15 MCG/2ML IN NEBU
15.0000 ug | INHALATION_SOLUTION | Freq: Two times a day (BID) | RESPIRATORY_TRACT | Status: DC
  Administered 2022-03-30 – 2022-03-31 (×2): 15 ug via RESPIRATORY_TRACT
  Filled 2022-03-30 (×3): qty 2

## 2022-03-30 MED ORDER — METHYLPREDNISOLONE SODIUM SUCC 125 MG IJ SOLR
60.0000 mg | Freq: Two times a day (BID) | INTRAMUSCULAR | Status: DC
  Administered 2022-03-30 – 2022-03-31 (×2): 60 mg via INTRAVENOUS
  Filled 2022-03-30 (×3): qty 2

## 2022-03-30 MED ORDER — LEVALBUTEROL HCL 0.63 MG/3ML IN NEBU
0.6300 mg | INHALATION_SOLUTION | Freq: Three times a day (TID) | RESPIRATORY_TRACT | Status: DC
  Administered 2022-03-31 (×2): 0.63 mg via RESPIRATORY_TRACT
  Filled 2022-03-30 (×3): qty 3

## 2022-03-30 MED ORDER — ACETAMINOPHEN 325 MG PO TABS
650.0000 mg | ORAL_TABLET | Freq: Four times a day (QID) | ORAL | Status: DC | PRN
  Administered 2022-03-30: 650 mg via ORAL
  Filled 2022-03-30: qty 2

## 2022-03-30 MED ORDER — IPRATROPIUM BROMIDE 0.02 % IN SOLN
0.5000 mg | Freq: Three times a day (TID) | RESPIRATORY_TRACT | Status: DC
  Administered 2022-03-31 (×2): 0.5 mg via RESPIRATORY_TRACT
  Filled 2022-03-30 (×3): qty 2.5

## 2022-03-30 MED ORDER — SODIUM CHLORIDE 0.9 % IV SOLN
1.0000 g | Freq: Every day | INTRAVENOUS | Status: DC
  Administered 2022-03-30 – 2022-03-31 (×2): 1 g via INTRAVENOUS
  Filled 2022-03-30 (×2): qty 10

## 2022-03-30 NOTE — Progress Notes (Addendum)
PROGRESS NOTE    DANN VENTRESS  PRF:[id number] DOB: 1985/09/05 DOA: 03/28/2022 PCP: Patient, No Pcp Per   Brief Narrative:  The patient is a 36 year old African-American female with a past medical history significant for but not limited to asthma cocaine abuse as well as other comorbidities who presents with increasing worsening shortness of breath and cough last 2 weeks.  She admits to having a sore throat as well as a frequent cough as well as increasing shortness of breath.  She thought she had fevers at the time when she her symptoms started and does not have any inhalers at home.  She continues to smoke 5 to 6 cigarettes daily and also reported snorting cocaine about 2-3 days ago but intends to quit and reportedly has a bed waiting for repair.  When EMS arrived she is found to be hypoxic on room air at 85% and was brought to the ED where she was tachycardic with a heart rate of 133 and she desaturated on ambulation.  She is given DuoNeb, 125 mg of Solu-Medrol en route by EMS and was given further albuterol treatment and IV magnesium in the ED.  She had a mild leukocytosis 12.9 and a mild hypokalemia.  Chest x-ray showed signs consistent with bronchitis but flu and COVID PCR were negative.  The hospitalist team was consulted for continued management acute hypoxia secondary to asthma exacerbation acute bronchitis and pneumonia.  Assessment and Plan: * Acute respiratory failure with hypoxia (HCC) in the setting of Bronchitis and Pneumonia complicated by Asthma Exacerbation  -Secondary to acute asthma exacerbation with O2 desaturation to 85% on room air requiring 2L -SpO2: 98 % O2 Flow Rate (L/min): 1 L/min -Continue supplemental oxygen via nasal cannula and wean O2 as tolerated -Continuous pulse oximetry maintain O2 saturation greater than 90% -Continue Xopenex nebulizer PRN q6hr due to asthma exacerbation; will start scheduled Xopenex and Atrovent every 6; will also start Brovana and budesonide  IH -Continue with daily systemic steroids with IV Solu-Medrol and will increase dose from 40 mg daily to 60 mg BID given her continued wheezing; She received 125 mg prior to arriving to the ED -Tussionex 5 mL p.o. nightly as as needed for cough -Added azithromycin 500 mg p.o. daily and will also add IV ceftriaxone now given her opacity -Checked a respiratory panel via PCR and this was negative -Will start Guaifenesin 1200 mg p.o. twice daily, incentive spirometry, flutter valve and a peak flow meter -Repeat a chest x-ray this AM done and showed "Heart size and mediastinal contours are within normal limits. Unchanged left suprahilar opacity. Lungs otherwise clear. Old" left-sided rib fractures. -She will need an Ambulatory home O2 screen prior to discharge -If not improving may need pulmonary consultation and evaluation   Tobacco Use -Reports 5-6 cigarettes use daily.  -Declines nicotine patch here -Encouraged cessation.   Cocaine Abuse (HCC) -Reports last use about 2-3 days ago.  -Intents to quit and reportedly has rehab bed waiting for her. -UDS + for Cocaine -No signs of active withdrawal. Continue to monitor.   Tachycardia, improving  -likely secondary to increase albuterol use and reactive to her hypoxia -On Xopenex to minimize side effects -Continue to Keep on continuous telemetry -Also has anxiety. Give one time dose hydroxyzine -Continue to Monitor    Hypokalemia -Mild at 3.2 and improved to 4.3 yesterday and is now 4.1 today  -Checked Mag Level and was 1.9 -Continue to Monitor and Replete as Necessary -Repeat CMP in the AM  Obesity -Complicates overall prognosis and care -Estimated body mass index is 35.45 kg/m as calculated from the following:   Height as of 12/11/19:  (1.651 m).   Weight as of 12/11/19: 96.6 kg.  -Weight Loss and Dietary Counseling given  Thrombocytosis -Mild and Likely Reactive and patient's Platelet Count went from 537 -> 476 -> 468 ->  426 -Continue to Monitor and Trend and repeat CBC in the AM   DVT prophylaxis: SCDs Start: 03/28/22 2226    Code Status: Full Code Family Communication: No family present at bedside   Disposition Plan:  Level of care: Telemetry Status is: Inpatient Remains inpatient appropriate because: Continues to remain on supplemental oxygen continues to wheeze and needs further clinical improvement.  Shortness of breath is only mildly improved.   Consultants:  None  Procedures:  As delineated as above  Antimicrobials:  Anti-infectives (From admission, onward)    Start     Dose/Rate Route Frequency Ordered Stop   03/30/22 1215  cefTRIAXone (ROCEPHIN) 1 g in sodium chloride 0.9 % 100 mL IVPB        1 g 200 mL/hr over 30 Minutes Intravenous Daily 03/30/22 1121 04/04/22 0959   03/29/22 1715  azithromycin (ZITHROMAX) tablet 500 mg        500 mg Oral Daily 03/29/22 1625         Subjective: Seen and examined at bedside and still was coughing quite a bit and had shortness of breath.  Continues to wheeze.  States that she has never had an asthma exacerbation this bad.  Feels okay but just very tired.  No other concerns or complaints at this time.  Objective: Vitals:   03/30/22 0402 03/30/22 0827 03/30/22 1342 03/30/22 1409  BP: 114/84  134/72   Pulse: 80  91   Resp: 19  (!) 22   Temp: 98.2 F (36.8 C)  99 F (37.2 C)   TempSrc: Oral     SpO2: 97% 100% 97% 98%    Intake/Output Summary (Last 24 hours) at 03/30/2022 1416 Last data filed at 03/29/2022 1525 Gross per 24 hour  Intake 354 ml  Output --  Net 354 ml   There were no vitals filed for this visit.  Examination: Physical Exam:  Constitutional: Thin African-American female currently no acute distress appears calm Respiratory: Diminished to auscultation bilaterally with some coarse breath sounds some rhonchi worse on the left compared to right with some expiratory wheezing.  No appreciable rales or crackles noted.  Wearing  supplemental oxygen via nasal cam but has unlabored breathing and no accessory muscle usage Cardiovascular: RRR, no murmurs / rubs / gallops. S1 and S2 auscultated. No extremity edema.  Abdomen: Soft, non-tender, non-distended. Bowel sounds positive.  GU: Deferred. Musculoskeletal: No clubbing / cyanosis of digits/nails. No joint deformity upper and lower extremities. Skin: No rashes, lesions, ulcers on limited skin evaluation. No induration; Warm and dry.  Neurologic: CN 2-12 grossly intact with no focal deficits. Romberg sign and cerebellar reflexes not assessed.  Psychiatric: Normal judgment and insight. Alert and oriented x 3. Normal mood and appropriate affect.   Data Reviewed: I have personally reviewed following labs and imaging studies  CBC: Recent Labs  Lab 03/28/22 1729 03/29/22 0245 03/30/22 0516  WBC 12.9* 9.6 13.8*  NEUTROABS 9.1*  --  8.9*  HGB 13.1 12.1 11.8*  HCT 41.7 38.3 37.4  MCV 82.2 81.7 83.1  PLT 476* 468* 426*   Basic Metabolic Panel: Recent Labs  Lab 03/28/22 1729 03/29/22  0245 03/30/22 0516  NA 137 137 137  K 3.2* 4.3 4.1  CL 104 103 104  CO2 24 24 28   GLUCOSE 140* 137* 93  BUN 9 12 19   CREATININE 0.66 0.68 0.74  CALCIUM 8.4* 8.9 8.5*  MG  --   --  1.9  PHOS  --   --  3.8   GFR: CrCl cannot be calculated (Unknown ideal weight.). Liver Function Tests: Recent Labs  Lab 03/30/22 0516  AST 26  ALT 24  ALKPHOS 66  BILITOT 0.2*  PROT 7.4  ALBUMIN 2.7*   No results for input(s): "LIPASE", "AMYLASE" in the last 168 hours. No results for input(s): "AMMONIA" in the last 168 hours. Coagulation Profile: No results for input(s): "INR", "PROTIME" in the last 168 hours. Cardiac Enzymes: No results for input(s): "CKTOTAL", "CKMB", "CKMBINDEX", "TROPONINI" in the last 168 hours. BNP (last 3 results) No results for input(s): "PROBNP" in the last 8760 hours. HbA1C: No results for input(s): "HGBA1C" in the last 72 hours. CBG: No results for  input(s): "GLUCAP" in the last 168 hours. Lipid Profile: No results for input(s): "CHOL", "HDL", "LDLCALC", "TRIG", "CHOLHDL", "LDLDIRECT" in the last 72 hours. Thyroid Function Tests: No results for input(s): "TSH", "T4TOTAL", "FREET4", "T3FREE", "THYROIDAB" in the last 72 hours. Anemia Panel: No results for input(s): "VITAMINB12", "FOLATE", "FERRITIN", "TIBC", "IRON", "RETICCTPCT" in the last 72 hours. Sepsis Labs: No results for input(s): "PROCALCITON", "LATICACIDVEN" in the last 168 hours.  Recent Results (from the past 240 hour(s))  Resp Panel by RT-PCR (Flu A&B, Covid) Anterior Nasal Swab     Status: None   Collection Time: 03/22/22 10:30 PM   Specimen: Anterior Nasal Swab  Result Value Ref Range Status   SARS Coronavirus 2 by RT PCR NEGATIVE NEGATIVE Final    Comment: (NOTE) SARS-CoV-2 target nucleic acids are NOT DETECTED.  The SARS-CoV-2 RNA is generally detectable in upper respiratory specimens during the acute phase of infection. The lowest concentration of SARS-CoV-2 viral copies this assay can detect is 138 copies/mL. A negative result does not preclude SARS-Cov-2 infection and should not be used as the sole basis for treatment or other patient management decisions. A negative result may occur with  improper specimen collection/handling, submission of specimen other than nasopharyngeal swab, presence of viral mutation(s) within the areas targeted by this assay, and inadequate number of viral copies(<138 copies/mL). A negative result must be combined with clinical observations, patient history, and epidemiological information. The expected result is Negative.  Fact Sheet for Patients:  14/05/23  Fact Sheet for Healthcare Providers:  03/24/22  This test is no t yet approved or cleared by the [employer] FDA and  has been authorized for detection and/or diagnosis of SARS-CoV-2 by FDA under an  Emergency Use Authorization (EUA). This EUA will remain  in effect (meaning this test can be used) for the duration of the COVID-19 declaration under Section 564(b)(1) of the Act, 21 U.S.C.section 360bbb-3(b)(1), unless the authorization is terminated  or revoked sooner.       Influenza A by PCR NEGATIVE NEGATIVE Final   Influenza B by PCR NEGATIVE NEGATIVE Final    Comment: (NOTE) The Xpert Xpress SARS-CoV-2/FLU/RSV plus assay is intended as an aid in the diagnosis of influenza from Nasopharyngeal swab specimens and should not be used as a sole basis for treatment. Nasal washings and aspirates are unacceptable for Xpert Xpress SARS-CoV-2/FLU/RSV testing.  Fact Sheet for Patients: [employer]ious[employer]ker[employer]it  Fact Sheet for Healthcare Providers: [place]  This test is  not yet approved or cleared by the [place] and has been authorized for detection and/or diagnosis of SARS-CoV-2 by FDA under an Emergency Use Authorization (EUA). This EUA will remain in effect (meaning this test can be used) for the duration of the COVID-19 declaration under Section 564(b)(1) of the Act, 21 U.S.C. section 360bbb-3(b)(1), unless the authorization is terminated or revoked.  Performed at [facility], [address], [place], [place] [address]   Resp Panel by RT-PCR (Flu A&B, Covid) Anterior Nasal Swab     Status: None   Collection Time: 03/28/22  5:24 PM   Specimen: Anterior Nasal Swab  Result Value Ref Range Status   SARS Coronavirus 2 by RT PCR NEGATIVE NEGATIVE Final    Comment: (NOTE) SARS-CoV-2 target nucleic acids are NOT DETECTED.  The SARS-CoV-2 RNA is generally detectable in upper respiratory specimens during the acute phase of infection. The lowest concentration of SARS-CoV-2 viral copies this assay can detect is 138 copies/mL. A negative result does not preclude SARS-Cov-2 infection and should not be used as the sole  basis for treatment or other patient management decisions. A negative result may occur with  improper specimen collection/handling, submission of specimen other than nasopharyngeal swab, presence of viral mutation(s) within the areas targeted by this assay, and inadequate number of viral copies(<138 copies/mL). A negative result must be combined with clinical observations, patient history, and epidemiological information. The expected result is Negative.  Fact Sheet for Patients:  [employer]  Fact Sheet for Healthcare Providers:  [employer]ker[employer]it  This test is no t yet approved or cleared by the [place] FDA and  has been authorized for detection and/or diagnosis of SARS-CoV-2 by FDA under an Emergency Use Authorization (EUA). This EUA will remain  in effect (meaning this test can be used) for the duration of the COVID-19 declaration under Section 564(b)(1) of the Act, 21 U.S.C.section 360bbb-3(b)(1), unless the authorization is terminated  or revoked sooner.       Influenza A by PCR NEGATIVE NEGATIVE Final   Influenza B by PCR NEGATIVE NEGATIVE Final    Comment: (NOTE) The Xpert Xpress SARS-CoV-2/FLU/RSV plus assay is intended as an aid in the diagnosis of influenza from Nasopharyngeal swab specimens and should not be used as a sole basis for treatment. Nasal washings and aspirates are unacceptable for Xpert Xpress SARS-CoV-2/FLU/RSV testing.  Fact Sheet for Patients: [employer]gerCourse.com  Fact Sheet for Healthcare Providers: SeriousBroker.it  This test is not yet approved or cleared by the [place] FDA and has been authorized for detection and/or diagnosis of SARS-CoV-2 by FDA under an Emergency Use Authorization (EUA). This EUA will remain in effect (meaning this test can be used) for the duration of the COVID-19 declaration under Section 564(b)(1) of the Act,  21 U.S.C. section 360bbb-3(b)(1), unless the authorization is terminated or revoked.  Performed at [facility], [address] [place], [place] [address]   Respiratory (~20 pathogens) panel by PCR     Status: None   Collection Time: 03/28/22  5:24 PM   Specimen: Nasopharyngeal Swab; Respiratory  Result Value Ref Range Status   Adenovirus NOT DETECTED NOT DETECTED Final   Coronavirus 229E NOT DETECTED NOT DETECTED Final    Comment: (NOTE) The Coronavirus on the Respiratory Panel, DOES NOT test for the novel  Coronavirus (2019 nCoV)    Coronavirus HKU1 NOT DETECTED NOT DETECTED Final   Coronavirus NL63 NOT DETECTED NOT DETECTED Final   Coronavirus OC43 NOT DETECTED NOT DETECTED  Final   Metapneumovirus NOT DETECTED NOT DETECTED Final   Rhinovirus / Enterovirus NOT DETECTED NOT DETECTED Final   Influenza A NOT DETECTED NOT DETECTED Final   Influenza B NOT DETECTED NOT DETECTED Final   Parainfluenza Virus 1 NOT DETECTED NOT DETECTED Final   Parainfluenza Virus 2 NOT DETECTED NOT DETECTED Final   Parainfluenza Virus 3 NOT DETECTED NOT DETECTED Final   Parainfluenza Virus 4 NOT DETECTED NOT DETECTED Final   Respiratory Syncytial Virus NOT DETECTED NOT DETECTED Final   Bordetella pertussis NOT DETECTED NOT DETECTED Final   Bordetella Parapertussis NOT DETECTED NOT DETECTED Final   Chlamydophila pneumoniae NOT DETECTED NOT DETECTED Final   Mycoplasma pneumoniae NOT DETECTED NOT DETECTED Final    Comment: Performed at [facility], [address] [place], [place] [address]     Radiology Studies: DG CHEST PORT 1 VIEW  Result Date: 03/30/2022 CLINICAL DATA:  Shortness of breath EXAM: PORTABLE CHEST 1 VIEW COMPARISON:  Chest x-ray dated March 28, 2022 FINDINGS: Heart size and mediastinal contours are within normal limits. Unchanged left suprahilar opacity. Lungs otherwise clear. Old left-sided rib fractures. IMPRESSION: Unchanged left suprahilar opacity,  concerning for infection. Electronically Signed   By: Allegra LaiLeah  Strickland M.D.   On: 03/30/2022 08:17   DG Chest 2 View  Result Date: 03/28/2022 CLINICAL DATA:  Short of breath EXAM: CHEST - 2 VIEW COMPARISON:  Prior chest x-ray 03/22/2022 FINDINGS: The lungs are clear and negative for focal airspace consolidation, pulmonary edema or suspicious pulmonary nodule. No pleural effusion or pneumothorax. Diffuse mild peribronchial cuffing in a perihilar and bibasilar distribution. Cardiac and mediastinal contours are within normal limits. No acute fracture or lytic or blastic osseous lesions. The visualized upper abdominal bowel gas pattern is unremarkable. IMPRESSION: Diffuse mild peribronchial cuffing in a perihilar and bibasilar distribution. Differential considerations include acute versus chronic bronchitis. Findings are similar compared to relatively recent prior imaging. Electronically Signed   By: Malachy MoanHeath  McCullough M.D.   On: 03/28/2022 17:36    Scheduled Meds:  azithromycin  500 mg Oral Daily   guaiFENesin  1,200 mg Oral BID   ipratropium  0.5 mg Nebulization Q6H   levalbuterol  0.63 mg Nebulization Q6H   methylPREDNISolone (SOLU-MEDROL) injection  60 mg Intravenous Q12H   Continuous Infusions:  cefTRIAXone (ROCEPHIN)  IV 1 g (03/30/22 1340)    LOS: 1 day   Marguerita Merlesmair Shamel Galyean, DO [facility] Available via Epic secure chat 7am-7pm After these hours, please refer to coverage provider listed on [email or web address] 03/30/2022, 2:16 PM

## 2022-03-31 DIAGNOSIS — J9601 Acute respiratory failure with hypoxia: Secondary | ICD-10-CM | POA: Diagnosis not present

## 2022-03-31 LAB — CBC WITH DIFFERENTIAL/PLATELET
Abs Immature Granulocytes: 0.06 10*3/uL (ref 0.00–0.07)
Basophils Absolute: 0 10*3/uL (ref 0.0–0.1)
Basophils Relative: 0 %
Eosinophils Absolute: 0 10*3/uL (ref 0.0–0.5)
Eosinophils Relative: 0 %
HCT: 41.1 % (ref 36.0–46.0)
Hemoglobin: 12.5 g/dL (ref 12.0–15.0)
Immature Granulocytes: 0 %
Lymphocytes Relative: 10 %
Lymphs Abs: 1.4 10*3/uL (ref 0.7–4.0)
MCH: 25.6 pg — ABNORMAL LOW (ref 26.0–34.0)
MCHC: 30.4 g/dL (ref 30.0–36.0)
MCV: 84.2 fL (ref 80.0–100.0)
Monocytes Absolute: 0.2 10*3/uL (ref 0.1–1.0)
Monocytes Relative: 2 %
Neutro Abs: 12.8 10*3/uL — ABNORMAL HIGH (ref 1.7–7.7)
Neutrophils Relative %: 88 %
Platelets: 493 10*3/uL — ABNORMAL HIGH (ref 150–400)
RBC: 4.88 MIL/uL (ref 3.87–5.11)
RDW: 15.7 % — ABNORMAL HIGH (ref 11.5–15.5)
WBC: 14.5 10*3/uL — ABNORMAL HIGH (ref 4.0–10.5)
nRBC: 0 % (ref 0.0–0.2)

## 2022-03-31 LAB — COMPREHENSIVE METABOLIC PANEL
ALT: 27 U/L (ref 0–44)
AST: 25 U/L (ref 15–41)
Albumin: 3.2 g/dL — ABNORMAL LOW (ref 3.5–5.0)
Alkaline Phosphatase: 75 U/L (ref 38–126)
Anion gap: 8 (ref 5–15)
BUN: 19 mg/dL (ref 6–20)
CO2: 24 mmol/L (ref 22–32)
Calcium: 9.1 mg/dL (ref 8.9–10.3)
Chloride: 102 mmol/L (ref 98–111)
Creatinine, Ser: 0.66 mg/dL (ref 0.44–1.00)
GFR, Estimated: >60 mL/min (ref >60)
Glucose, Bld: 170 mg/dL — ABNORMAL HIGH (ref 70–99)
Potassium: 4.3 mmol/L (ref 3.5–5.1)
Sodium: 134 mmol/L — ABNORMAL LOW (ref 135–145)
Total Bilirubin: 0.1 mg/dL — ABNORMAL LOW (ref 0.3–1.2)
Total Protein: 7.9 g/dL (ref 6.5–8.1)

## 2022-03-31 LAB — PHOSPHORUS: Phosphorus: 3.5 mg/dL (ref 2.5–4.6)

## 2022-03-31 LAB — MAGNESIUM: Magnesium: 1.7 mg/dL (ref 1.7–2.4)

## 2022-03-31 MED ORDER — LORAZEPAM 2 MG/ML IJ SOLN
1.0000 mg | Freq: Once | INTRAMUSCULAR | Status: DC
  Filled 2022-03-31: qty 1

## 2022-03-31 NOTE — Progress Notes (Signed)
                                                                                      Against Medical Advice  Night RN reported prior suspicious between visitor and patient that was thought to be illicit drug use related. A tele sitter was requested at the beginning of this shift due to this behavior. Patient refused to be observed and now wished to leave AMA. RN and [facility] both educated patient about her care. Patient is A/O x4. Prior full set of VS stable.   Patient at this time expresses desire to leave the Hospital immediately, patient has been warned that this is not Medically advisable at this time, and can result in Medical complications like Death and Disability, patient understands and accepts the risks involved and assumes full responsibilty of this decision.  This patient has also been advised that if they feel the need for further medical assistance to return to any available ER or dial [phone].  Informed by Nursing staff that this patient has left care and has signed the form  Against Medical Advice on 03/31/2022 at 1950.     Anthoney Harada, DNP, ACNPC- AG [facility]

## 2022-03-31 NOTE — Progress Notes (Signed)
  Transition of Care (TOC) Screening Note   Patient Details  Name: Rachel Stanley Date of Birth: 1986/01/25   Transition of Care [facility]) CM/SW Contact:    Otelia Santee, LCSW Phone Number: 03/31/2022, 3:04 PM    Transition of Care Department [facility]) has reviewed patient and no TOC needs have been identified at this time. We will continue to monitor patient advancement through interdisciplinary progression rounds. If new patient transition needs arise, please place a TOC consult.

## 2022-03-31 NOTE — Progress Notes (Signed)
Pt HR escalated to 160's. This nurse notified physician and was instructed to give ativan. Due to erratic behavior a telesitter was ordered and when the pt was told she decided to leave AMA. Paper work was signed and next of KIN was notified.

## 2022-03-31 NOTE — Progress Notes (Signed)
PROGRESS NOTE    Rachel Stanley  VQQ:[id number] DOB: Sep 13, 1985 DOA: 03/28/2022 PCP: Patient, No Pcp Per    Brief Narrative:  Patient is 36 year old female with history of mild intermittent asthma currently not on any treatment, smoker and cocaine user presented with 2 weeks of worsening shortness of breath and cough.  He started with sore throat and frequent coughing.  In the emergency room hemodynamically stable.  Significant wheezing and tachycardia.  Initially on room air 85%.  COVID-19 and influenza negative.  Given multiple bronchodilators still symptomatic so admitted to the hospital.   Assessment & Plan:   Acute exacerbation of chronic intermittent asthma: Acute hypoxemic respiratory failure with evidence of saturation 85% on arrival to ER. Continues to have significant wheezing and bronchospasm on mobility. Aggressive bronchodilator therapy, IV steroids, inhalational steroids, scheduled and as needed bronchodilators, deep breathing exercises, incentive spirometry, mobilize in the hallway. Started on antibiotics since admission Rocephin and azithromycin.  We will continue until clinical improvement. Supplemental oxygen to keep saturations more than 92%.  Smoker: Counseled to quit.  Nicotine patch.  Cocaine abuse: Counseled.  Planning for outpatient rehab.  Hypokalemia: Improved.   Underweight : Body mass index is 18.01 kg/m.   Patient with recent weight loss.  HIV test was negative.  She will need nutritional evaluation.       DVT prophylaxis: SCDs Start: 03/28/22 2226   Code Status: Full code Family Communication: Mother at the bedside Disposition Plan: Status is: Inpatient Remains inpatient appropriate because: Significant wheezing     Consultants:  None  Procedures:  None  Antimicrobials:  Rocephin and azithromycin 12/4--   Subjective: Patient was seen and examined.  Mother was at the bedside.  At rest she had good symptom control.  We mobilized her in  the hallway, she had chest tightness and bronchospasm with tachypnea and tachycardia.  Afebrile.  Remains on minimum oxygen. Patient was requesting albuterol nebulizer on discharge.  Apparently she is not on any maintenance treatment for her asthma.  Objective: Vitals:   03/30/22 2048 03/30/22 2220 03/31/22 0558 03/31/22 0742  BP:  128/78 125/71   Pulse:  81 83   Resp:  18 16   Temp:  97.8 F (36.6 C) 98.2 F (36.8 C)   TempSrc:   Oral   SpO2:  97% 95% 95%  Weight: 49.1 kg     Height: 5\' 5"  (1.651 m)       Intake/Output Summary (Last 24 hours) at 03/31/2022 1338 Last data filed at 03/31/2022 0300 Gross per 24 hour  Intake 100 ml  Output --  Net 100 ml   Filed Weights   03/30/22 2048  Weight: 49.1 kg    Examination:  General exam: Appears anxious on mobility.  Not in much distress. Thin and cachectic. Respiratory system: Mostly clear.  She has scattered expiratory wheezes.  Anxious with increased work of breathing on mobility. Cardiovascular system: S1 & S2 heard, RRR.  Gastrointestinal system: Abdomen is nondistended, soft and nontender. No organomegaly or masses felt. Normal bowel sounds heard. Central nervous system: Alert and oriented. No focal neurological deficits. Extremities: Symmetric 5 x 5 power.    Data Reviewed: I have personally reviewed following labs and imaging studies  CBC: Recent Labs  Lab 03/28/22 1729 03/29/22 0245 03/30/22 0516 03/31/22 0533  WBC 12.9* 9.6 13.8* 14.5*  NEUTROABS 9.1*  --  8.9* 12.8*  HGB 13.1 12.1 11.8* 12.5  HCT 41.7 38.3 37.4 41.1  MCV 82.2 81.7 83.1 84.2  PLT  476* 468* 426* 493*   Basic Metabolic Panel: Recent Labs  Lab 03/28/22 1729 03/29/22 0245 03/30/22 0516 03/31/22 0533  NA 137 137 137 134*  K 3.2* 4.3 4.1 4.3  CL 104 103 104 102  CO2 24 24 28 24   GLUCOSE 140* 137* 93 170*  BUN 9 12 19 19   CREATININE 0.66 0.68 0.74 0.66  CALCIUM 8.4* 8.9 8.5* 9.1  MG  --   --  1.9 1.7  PHOS  --   --  3.8 3.5    GFR: Estimated Creatinine Clearance: 74.6 mL/min (by C-G formula based on SCr of 0.66 mg/dL). Liver Function Tests: Recent Labs  Lab 03/30/22 0516 03/31/22 0533  AST 26 25  ALT 24 27  ALKPHOS 66 75  BILITOT 0.2* 0.1*  PROT 7.4 7.9  ALBUMIN 2.7* 3.2*   No results for input(s): "LIPASE", "AMYLASE" in the last 168 hours. No results for input(s): "AMMONIA" in the last 168 hours. Coagulation Profile: No results for input(s): "INR", "PROTIME" in the last 168 hours. Cardiac Enzymes: No results for input(s): "CKTOTAL", "CKMB", "CKMBINDEX", "TROPONINI" in the last 168 hours. BNP (last 3 results) No results for input(s): "PROBNP" in the last 8760 hours. HbA1C: No results for input(s): "HGBA1C" in the last 72 hours. CBG: No results for input(s): "GLUCAP" in the last 168 hours. Lipid Profile: No results for input(s): "CHOL", "HDL", "LDLCALC", "TRIG", "CHOLHDL", "LDLDIRECT" in the last 72 hours. Thyroid Function Tests: No results for input(s): "TSH", "T4TOTAL", "FREET4", "T3FREE", "THYROIDAB" in the last 72 hours. Anemia Panel: No results for input(s): "VITAMINB12", "FOLATE", "FERRITIN", "TIBC", "IRON", "RETICCTPCT" in the last 72 hours. Sepsis Labs: No results for input(s): "PROCALCITON", "LATICACIDVEN" in the last 168 hours.  Recent Results (from the past 240 hour(s))  Resp Panel by RT-PCR (Flu A&B, Covid) Anterior Nasal Swab     Status: None   Collection Time: 03/22/22 10:30 PM   Specimen: Anterior Nasal Swab  Result Value Ref Range Status   SARS Coronavirus 2 by RT PCR NEGATIVE NEGATIVE Final    Comment: (NOTE) SARS-CoV-2 target nucleic acids are NOT DETECTED.  The SARS-CoV-2 RNA is generally detectable in upper respiratory specimens during the acute phase of infection. The lowest concentration of SARS-CoV-2 viral copies this assay can detect is 138 copies/mL. A negative result does not preclude SARS-Cov-2 infection and should not be used as the sole basis for treatment  or other patient management decisions. A negative result may occur with  improper specimen collection/handling, submission of specimen other than nasopharyngeal swab, presence of viral mutation(s) within the areas targeted by this assay, and inadequate number of viral copies(<138 copies/mL). A negative result must be combined with clinical observations, patient history, and epidemiological information. The expected result is Negative.  Fact Sheet for Patients:  14/06/23  Fact Sheet for Healthcare Providers:  03/24/22  This test is no t yet approved or cleared by the [employer] FDA and  has been authorized for detection and/or diagnosis of SARS-CoV-2 by FDA under an Emergency Use Authorization (EUA). This EUA will remain  in effect (meaning this test can be used) for the duration of the COVID-19 declaration under Section 564(b)(1) of the Act, 21 U.S.C.section 360bbb-3(b)(1), unless the authorization is terminated  or revoked sooner.       Influenza A by PCR NEGATIVE NEGATIVE Final   Influenza B by PCR NEGATIVE NEGATIVE Final    Comment: (NOTE) The Xpert Xpress SARS-CoV-2/FLU/RSV plus assay is intended as an aid in the diagnosis of influenza  from Nasopharyngeal swab specimens and should not be used as a sole basis for treatment. Nasal washings and aspirates are unacceptable for Xpert Xpress SARS-CoV-2/FLU/RSV testing.  Fact Sheet for Patients: [employer]Course[employer]comhttps://www.fda.gov/media/152166/download  Fact Sheet for Healthcare Providers: SeriousBroker.ithttps://www.fda.gov/media/152162/download  This test is not yet approved or cleared by the [place] FDA and has been authorized for detection and/or diagnosis of SARS-CoV-2 by FDA under an Emergency Use Authorization (EUA). This EUA will remain in effect (meaning this test can be used) for the duration of the COVID-19 declaration under Section 564(b)(1) of the Act, 21 U.S.C. section  360bbb-3(b)(1), unless the authorization is terminated or revoked.  Performed at [facility], [address] [place], [place] [address]   Resp Panel by RT-PCR (Flu A&B, Covid) Anterior Nasal Swab     Status: None   Collection Time: 03/28/22  5:24 PM   Specimen: Anterior Nasal Swab  Result Value Ref Range Status   SARS Coronavirus 2 by RT PCR NEGATIVE NEGATIVE Final    Comment: (NOTE) SARS-CoV-2 target nucleic acids are NOT DETECTED.  The SARS-CoV-2 RNA is generally detectable in upper respiratory specimens during the acute phase of infection. The lowest concentration of SARS-CoV-2 viral copies this assay can detect is 138 copies/mL. A negative result does not preclude SARS-Cov-2 infection and should not be used as the sole basis for treatment or other patient management decisions. A negative result may occur with  improper specimen collection/handling, submission of specimen other than nasopharyngeal swab, presence of viral mutation(s) within the areas targeted by this assay, and inadequate number of viral copies(<138 copies/mL). A negative result must be combined with clinical observations, patient history, and epidemiological information. The expected result is Negative.  Fact Sheet for Patients:  [employer]loggerCourse.comhttps://www.fda.gov/media/152166/download  Fact Sheet for Healthcare Providers:  SeriousBroker.ithttps://www.fda.gov/media/152162/download  This test is no t yet approved or cleared by the [place] FDA and  has been authorized for detection and/or diagnosis of SARS-CoV-2 by FDA under an Emergency Use Authorization (EUA). This EUA will remain  in effect (meaning this test can be used) for the duration of the COVID-19 declaration under Section 564(b)(1) of the Act, 21 U.S.C.section 360bbb-3(b)(1), unless the authorization is terminated  or revoked sooner.       Influenza A by PCR NEGATIVE NEGATIVE Final   Influenza B by PCR NEGATIVE NEGATIVE Final    Comment: (NOTE) The Xpert  Xpress SARS-CoV-2/FLU/RSV plus assay is intended as an aid in the diagnosis of influenza from Nasopharyngeal swab specimens and should not be used as a sole basis for treatment. Nasal washings and aspirates are unacceptable for Xpert Xpress SARS-CoV-2/FLU/RSV testing.  Fact Sheet for Patients: BloggerCourse.comhttps://www.fda.gov/media/152166/download  Fact Sheet for Healthcare Providers: SeriousBroker.ithttps://www.fda.gov/media/152162/download  This test is not yet approved or cleared by the [place] FDA and has been authorized for detection and/or diagnosis of SARS-CoV-2 by FDA under an Emergency Use Authorization (EUA). This EUA will remain in effect (meaning this test can be used) for the duration of the COVID-19 declaration under Section 564(b)(1) of the Act, 21 U.S.C. section 360bbb-3(b)(1), unless the authorization is terminated or revoked.  Performed at [facility], [address] [place], [place] [address]   Respiratory (~20 pathogens) panel by PCR     Status: None   Collection Time: 03/28/22  5:24 PM   Specimen: Nasopharyngeal Swab; Respiratory  Result Value Ref Range Status   Adenovirus NOT DETECTED NOT DETECTED Final   Coronavirus 229E NOT DETECTED NOT DETECTED Final    Comment: (NOTE)  The Coronavirus on the Respiratory Panel, DOES NOT test for the novel  Coronavirus (2019 nCoV)    Coronavirus HKU1 NOT DETECTED NOT DETECTED Final   Coronavirus NL63 NOT DETECTED NOT DETECTED Final   Coronavirus OC43 NOT DETECTED NOT DETECTED Final   Metapneumovirus NOT DETECTED NOT DETECTED Final   Rhinovirus / Enterovirus NOT DETECTED NOT DETECTED Final   Influenza A NOT DETECTED NOT DETECTED Final   Influenza B NOT DETECTED NOT DETECTED Final   Parainfluenza Virus 1 NOT DETECTED NOT DETECTED Final   Parainfluenza Virus 2 NOT DETECTED NOT DETECTED Final   Parainfluenza Virus 3 NOT DETECTED NOT DETECTED Final   Parainfluenza Virus 4 NOT DETECTED NOT DETECTED Final   Respiratory  Syncytial Virus NOT DETECTED NOT DETECTED Final   Bordetella pertussis NOT DETECTED NOT DETECTED Final   Bordetella Parapertussis NOT DETECTED NOT DETECTED Final   Chlamydophila pneumoniae NOT DETECTED NOT DETECTED Final   Mycoplasma pneumoniae NOT DETECTED NOT DETECTED Final    Comment: Performed at [facility], [address] [place], [place] [address]         Radiology Studies: DG CHEST PORT 1 VIEW  Result Date: 03/30/2022 CLINICAL DATA:  Shortness of breath EXAM: PORTABLE CHEST 1 VIEW COMPARISON:  Chest x-ray dated March 28, 2022 FINDINGS: Heart size and mediastinal contours are within normal limits. Unchanged left suprahilar opacity. Lungs otherwise clear. Old left-sided rib fractures. IMPRESSION: Unchanged left suprahilar opacity, concerning for infection. Electronically Signed   By: Allegra Lai M.D.   On: 03/30/2022 08:17        Scheduled Meds:  arformoterol  15 mcg Nebulization BID   azithromycin  500 mg Oral Daily   budesonide (PULMICORT) nebulizer solution  0.25 mg Nebulization BID   guaiFENesin  1,200 mg Oral BID   ipratropium  0.5 mg Nebulization TID   levalbuterol  0.63 mg Nebulization TID   methylPREDNISolone (SOLU-MEDROL) injection  60 mg Intravenous Q12H   Continuous Infusions:  cefTRIAXone (ROCEPHIN)  IV 1 g (03/31/22 1037)     LOS: 2 days    Time spent: 35 minutes    Dorcas Carrow, MD [facility] Pager [phone]

## 2022-04-01 NOTE — Discharge Summary (Signed)
Physician Discharge Summary  Rachel JunkerYvette N Stanley NWG:[id number] DOB: 1986-03-26 DOA: 03/28/2022  PCP: Patient, No Pcp Per  Admit date: 03/28/2022 Discharge date: 04/01/2022  Admitted From: Home Disposition: Left AGAINST MEDICAL ADVICE  Recommendations for Outpatient Follow-up:  Follow up with PCP in 1-2 weeks    Discharge Condition: Fair CODE STATUS: Full code Diet recommendation: Regular diet  Discharge summary: 36 year old with mild intermittent asthma currently not on any maintenance treatment, smoker and cocaine user admitted with 2 weeks of worsening shortness of breath and cough.  She was initially 85% on room air.  COVID-19 and influenza were negative.  Given multiple bronchodilator therapies and admitted to the hospital and treated as asthma exacerbation.  Patient was optimized on treatment and was still on IV steroids and IV antibiotics with slow recovery.  In the evening of 12/6, she had a visitor and after that she started acting irritable and restless.  Patient was provided with symptomatic treatment, more breathing treatment with stabilization.  Suspected she might have used drugs in the room, or TeleSitter was ordered.  Patient did not like her being monitored so decided to leave at night.  Patient was alert awake oriented and able to make her healthcare decisions by herself.  She was seen by nighttime covering provider while living.  Who was advised to come back for any worsening symptoms.  Patient left AMA.    Discharge Diagnoses:  Principal Problem:   Acute respiratory failure with hypoxia (HCC) Active Problems:   Hypokalemia   Tachycardia   Cocaine abuse (HCC)   Asthma exacerbation   Tobacco use    Discharge Instructions   Allergies as of 03/31/2022       Reactions   Nitrofurantoin Hives, Nausea And Vomiting   Penicillins Hives   States she was young but what she remembers is swelling and fever        Medication List     ASK your doctor about these  medications    DAYQUIL MULTI-SYMPTOM COLD/FLU PO Take 2 capsules by mouth every 6 (six) hours as needed (flu symptoms).   guaiFENesin 600 MG 12 hr tablet Commonly known as: MUCINEX Take 600 mg by mouth 2 (two) times daily as needed for cough or to loosen phlegm.   ibuprofen 200 MG tablet Commonly known as: ADVIL Take 400 mg by mouth every 6 (six) hours as needed for mild pain.        Allergies  Allergen Reactions   Nitrofurantoin Hives and Nausea And Vomiting   Penicillins Hives    States she was young but what she remembers is swelling and fever    Consultations: None   Procedures/Studies: DG CHEST PORT 1 VIEW  Result Date: 03/30/2022 CLINICAL DATA:  Shortness of breath EXAM: PORTABLE CHEST 1 VIEW COMPARISON:  Chest x-ray dated March 28, 2022 FINDINGS: Heart size and mediastinal contours are within normal limits. Unchanged left suprahilar opacity. Lungs otherwise clear. Old left-sided rib fractures. IMPRESSION: Unchanged left suprahilar opacity, concerning for infection. Electronically Signed   By: Allegra LaiLeah  Strickland M.D.   On: 03/30/2022 08:17   DG Chest 2 View  Result Date: 03/28/2022 CLINICAL DATA:  Short of breath EXAM: CHEST - 2 VIEW COMPARISON:  Prior chest x-ray 03/22/2022 FINDINGS: The lungs are clear and negative for focal airspace consolidation, pulmonary edema or suspicious pulmonary nodule. No pleural effusion or pneumothorax. Diffuse mild peribronchial cuffing in a perihilar and bibasilar distribution. Cardiac and mediastinal contours are within normal limits. No acute fracture or lytic or blastic  osseous lesions. The visualized upper abdominal bowel gas pattern is unremarkable. IMPRESSION: Diffuse mild peribronchial cuffing in a perihilar and bibasilar distribution. Differential considerations include acute versus chronic bronchitis. Findings are similar compared to relatively recent prior imaging. Electronically Signed   By: Malachy Moan M.D.   On: 03/28/2022  17:36   DG Chest 2 View  Result Date: 03/22/2022 CLINICAL DATA:  Chest pain and cough. EXAM: CHEST - 2 VIEW COMPARISON:  Sep 18, 2019 FINDINGS: Limited study secondary to patient rotation. The heart size and mediastinal contours are within normal limits. The lungs are hyperinflated. Mild linear atelectasis is seen within the left suprahilar region. There is no evidence of focal consolidation, pleural effusion or pneumothorax. Ill-defined chronic left rib deformities are noted. IMPRESSION: Mild left suprahilar linear atelectasis. Electronically Signed   By: Aram Candela M.D.   On: 03/22/2022 23:12   (Echo, Carotid, EGD, Colonoscopy, ERCP)    Subjective: Not seen by this provider when patient left.  She left AMA   Discharge Exam: Vitals:   03/31/22 1442 03/31/22 1525  BP:  135/77  Pulse:  95  Resp:  20  Temp:  97.9 F (36.6 C)  SpO2: 94% 95%   Vitals:   03/31/22 0558 03/31/22 0742 03/31/22 1442 03/31/22 1525  BP: 125/71   135/77  Pulse: 83   95  Resp: 16   20  Temp: 98.2 F (36.8 C)   97.9 F (36.6 C)  TempSrc: Oral     SpO2: 95% 95% 94% 95%  Weight:      Height:          The results of significant diagnostics from this hospitalization (including imaging, microbiology, ancillary and laboratory) are listed below for reference.     Microbiology: Recent Results (from the past 240 hour(s))  Resp Panel by RT-PCR (Flu A&B, Covid) Anterior Nasal Swab     Status: None   Collection Time: 03/22/22 10:30 PM   Specimen: Anterior Nasal Swab  Result Value Ref Range Status   SARS Coronavirus 2 by RT PCR NEGATIVE NEGATIVE Final    Comment: (NOTE) SARS-CoV-2 target nucleic acids are NOT DETECTED.  The SARS-CoV-2 RNA is generally detectable in upper respiratory specimens during the acute phase of infection. The lowest concentration of SARS-CoV-2 viral copies this assay can detect is 138 copies/mL. A negative result does not preclude SARS-Cov-2 infection and should not be used  as the sole basis for treatment or other patient management decisions. A negative result may occur with  improper specimen collection/handling, submission of specimen other than nasopharyngeal swab, presence of viral mutation(s) within the areas targeted by this assay, and inadequate number of viral copies(<138 copies/mL). A negative result must be combined with clinical observations, patient history, and epidemiological information. The expected result is Negative.  Fact Sheet for Patients:  BloggerCourse.com  Fact Sheet for Healthcare Providers:  SeriousBroker.it  This test is no t yet approved or cleared by the [place] FDA and  has been authorized for detection and/or diagnosis of SARS-CoV-2 by FDA under an Emergency Use Authorization (EUA). This EUA will remain  in effect (meaning this test can be used) for the duration of the COVID-19 declaration under Section 564(b)(1) of the Act, 21 U.S.C.section 360bbb-3(b)(1), unless the authorization is terminated  or revoked sooner.       Influenza A by PCR NEGATIVE NEGATIVE Final   Influenza B by PCR NEGATIVE NEGATIVE Final    Comment: (NOTE) The Xpert Xpress SARS-CoV-2/FLU/RSV plus assay is intended  as an aid in the diagnosis of influenza from Nasopharyngeal swab specimens and should not be used as a sole basis for treatment. Nasal washings and aspirates are unacceptable for Xpert Xpress SARS-CoV-2/FLU/RSV testing.  Fact Sheet for Patients: BloggerCourse.com  Fact Sheet for Healthcare Providers: SeriousBroker.it  This test is not yet approved or cleared by the [place] FDA and has been authorized for detection and/or diagnosis of SARS-CoV-2 by FDA under an Emergency Use Authorization (EUA). This EUA will remain in effect (meaning this test can be used) for the duration of the COVID-19 declaration under Section 564(b)(1)  of the Act, 21 U.S.C. section 360bbb-3(b)(1), unless the authorization is terminated or revoked.  Performed at [facility], [address], [place], [place] [address]   Resp Panel by RT-PCR (Flu A&B, Covid) Anterior Nasal Swab     Status: None   Collection Time: 03/28/22  5:24 PM   Specimen: Anterior Nasal Swab  Result Value Ref Range Status   SARS Coronavirus 2 by RT PCR NEGATIVE NEGATIVE Final    Comment: (NOTE) SARS-CoV-2 target nucleic acids are NOT DETECTED.  The SARS-CoV-2 RNA is generally detectable in upper respiratory specimens during the acute phase of infection. The lowest concentration of SARS-CoV-2 viral copies this assay can detect is 138 copies/mL. A negative result does not preclude SARS-Cov-2 infection and should not be used as the sole basis for treatment or other patient management decisions. A negative result may occur with  improper specimen collection/handling, submission of specimen other than nasopharyngeal swab, presence of viral mutation(s) within the areas targeted by this assay, and inadequate number of viral copies(<138 copies/mL). A negative result must be combined with clinical observations, patient history, and epidemiological information. The expected result is Negative.  Fact Sheet for Patients:  BloggerCourse.com  Fact Sheet for Healthcare Providers:  SeriousBroker.it  This test is no t yet approved or cleared by the [place] FDA and  has been authorized for detection and/or diagnosis of SARS-CoV-2 by FDA under an Emergency Use Authorization (EUA). This EUA will remain  in effect (meaning this test can be used) for the duration of the COVID-19 declaration under Section 564(b)(1) of the Act, 21 U.S.C.section 360bbb-3(b)(1), unless the authorization is terminated  or revoked sooner.       Influenza A by PCR NEGATIVE NEGATIVE Final   Influenza B by PCR NEGATIVE NEGATIVE Final     Comment: (NOTE) The Xpert Xpress SARS-CoV-2/FLU/RSV plus assay is intended as an aid in the diagnosis of influenza from Nasopharyngeal swab specimens and should not be used as a sole basis for treatment. Nasal washings and aspirates are unacceptable for Xpert Xpress SARS-CoV-2/FLU/RSV testing.  Fact Sheet for Patients: BloggerCourse.com  Fact Sheet for Healthcare Providers: SeriousBroker.it  This test is not yet approved or cleared by the [place] FDA and has been authorized for detection and/or diagnosis of SARS-CoV-2 by FDA under an Emergency Use Authorization (EUA). This EUA will remain in effect (meaning this test can be used) for the duration of the COVID-19 declaration under Section 564(b)(1) of the Act, 21 U.S.C. section 360bbb-3(b)(1), unless the authorization is terminated or revoked.  Performed at [facility], [address], [place], [place] [address]   Respiratory (~20 pathogens) panel by PCR     Status: None   Collection Time: 03/28/22  5:24 PM   Specimen: Nasopharyngeal Swab; Respiratory  Result Value Ref Range Status   Adenovirus NOT DETECTED NOT DETECTED Final   Coronavirus 229E NOT DETECTED  NOT DETECTED Final    Comment: (NOTE) The Coronavirus on the Respiratory Panel, DOES NOT test for the novel  Coronavirus (2019 nCoV)    Coronavirus HKU1 NOT DETECTED NOT DETECTED Final   Coronavirus NL63 NOT DETECTED NOT DETECTED Final   Coronavirus OC43 NOT DETECTED NOT DETECTED Final   Metapneumovirus NOT DETECTED NOT DETECTED Final   Rhinovirus / Enterovirus NOT DETECTED NOT DETECTED Final   Influenza A NOT DETECTED NOT DETECTED Final   Influenza B NOT DETECTED NOT DETECTED Final   Parainfluenza Virus 1 NOT DETECTED NOT DETECTED Final   Parainfluenza Virus 2 NOT DETECTED NOT DETECTED Final   Parainfluenza Virus 3 NOT DETECTED NOT DETECTED Final   Parainfluenza Virus 4 NOT DETECTED NOT DETECTED  Final   Respiratory Syncytial Virus NOT DETECTED NOT DETECTED Final   Bordetella pertussis NOT DETECTED NOT DETECTED Final   Bordetella Parapertussis NOT DETECTED NOT DETECTED Final   Chlamydophila pneumoniae NOT DETECTED NOT DETECTED Final   Mycoplasma pneumoniae NOT DETECTED NOT DETECTED Final    Comment: Performed at [facility], [address], [place], [place] [address]     Labs: BNP (last 3 results) No results for input(s): "BNP" in the last 8760 hours. Basic Metabolic Panel: Recent Labs  Lab 03/28/22 1729 03/29/22 0245 03/30/22 0516 03/31/22 0533  NA 137 137 137 134*  K 3.2* 4.3 4.1 4.3  CL 104 103 104 102  CO2 24 24 28 24   GLUCOSE 140* 137* 93 170*  BUN 9 12 19 19   CREATININE 0.66 0.68 0.74 0.66  CALCIUM 8.4* 8.9 8.5* 9.1  MG  --   --  1.9 1.7  PHOS  --   --  3.8 3.5   Liver Function Tests: Recent Labs  Lab 03/30/22 0516 03/31/22 0533  AST 26 25  ALT 24 27  ALKPHOS 66 75  BILITOT 0.2* 0.1*  PROT 7.4 7.9  ALBUMIN 2.7* 3.2*   No results for input(s): "LIPASE", "AMYLASE" in the last 168 hours. No results for input(s): "AMMONIA" in the last 168 hours. CBC: Recent Labs  Lab 03/28/22 1729 03/29/22 0245 03/30/22 0516 03/31/22 0533  WBC 12.9* 9.6 13.8* 14.5*  NEUTROABS 9.1*  --  8.9* 12.8*  HGB 13.1 12.1 11.8* 12.5  HCT 41.7 38.3 37.4 41.1  MCV 82.2 81.7 83.1 84.2  PLT 476* 468* 426* 493*   Cardiac Enzymes: No results for input(s): "CKTOTAL", "CKMB", "CKMBINDEX", "TROPONINI" in the last 168 hours. BNP: Invalid input(s): "POCBNP" CBG: No results for input(s): "GLUCAP" in the last 168 hours. D-Dimer No results for input(s): "DDIMER" in the last 72 hours. Hgb A1c No results for input(s): "HGBA1C" in the last 72 hours. Lipid Profile No results for input(s): "CHOL", "HDL", "LDLCALC", "TRIG", "CHOLHDL", "LDLDIRECT" in the last 72 hours. Thyroid function studies No results for input(s): "TSH", "T4TOTAL", "T3FREE", "THYROIDAB" in the last 72  hours.  Invalid input(s): "FREET3" Anemia work up No results for input(s): "VITAMINB12", "FOLATE", "FERRITIN", "TIBC", "IRON", "RETICCTPCT" in the last 72 hours. Urinalysis    Component Value Date/Time   COLORURINE STRAW (A) 03/12/2019 1031   APPEARANCEUR CLEAR 03/12/2019 1031   LABSPEC 1.012 03/12/2019 1031   PHURINE 7.0 03/12/2019 1031   GLUCOSEU NEGATIVE 03/12/2019 1031   HGBUR NEGATIVE 03/12/2019 1031   BILIRUBINUR NEGATIVE 03/12/2019 1031   KETONESUR NEGATIVE 03/12/2019 1031   PROTEINUR NEGATIVE 03/12/2019 1031   UROBILINOGEN 1.0 04/13/2010 1656   NITRITE NEGATIVE 03/12/2019 1031   LEUKOCYTESUR NEGATIVE 03/12/2019 1031   Sepsis Labs  Recent Labs  Lab 03/28/22 1729 03/29/22 0245 03/30/22 0516 03/31/22 0533  WBC 12.9* 9.6 13.8* 14.5*   Microbiology Recent Results (from the past 240 hour(s))  Resp Panel by RT-PCR (Flu A&B, Covid) Anterior Nasal Swab     Status: None   Collection Time: 03/22/22 10:30 PM   Specimen: Anterior Nasal Swab  Result Value Ref Range Status   SARS Coronavirus 2 by RT PCR NEGATIVE NEGATIVE Final    Comment: (NOTE) SARS-CoV-2 target nucleic acids are NOT DETECTED.  The SARS-CoV-2 RNA is generally detectable in upper respiratory specimens during the acute phase of infection. The lowest concentration of SARS-CoV-2 viral copies this assay can detect is 138 copies/mL. A negative result does not preclude SARS-Cov-2 infection and should not be used as the sole basis for treatment or other patient management decisions. A negative result may occur with  improper specimen collection/handling, submission of specimen other than nasopharyngeal swab, presence of viral mutation(s) within the areas targeted by this assay, and inadequate number of viral copies(<138 copies/mL). A negative result must be combined with clinical observations, patient history, and epidemiological information. The expected result is Negative.  Fact Sheet for Patients:   BloggerCourse.com  Fact Sheet for Healthcare Providers:  SeriousBroker.it  This test is no t yet approved or cleared by the [place] FDA and  has been authorized for detection and/or diagnosis of SARS-CoV-2 by FDA under an Emergency Use Authorization (EUA). This EUA will remain  in effect (meaning this test can be used) for the duration of the COVID-19 declaration under Section 564(b)(1) of the Act, 21 U.S.C.section 360bbb-3(b)(1), unless the authorization is terminated  or revoked sooner.       Influenza A by PCR NEGATIVE NEGATIVE Final   Influenza B by PCR NEGATIVE NEGATIVE Final    Comment: (NOTE) The Xpert Xpress SARS-CoV-2/FLU/RSV plus assay is intended as an aid in the diagnosis of influenza from Nasopharyngeal swab specimens and should not be used as a sole basis for treatment. Nasal washings and aspirates are unacceptable for Xpert Xpress SARS-CoV-2/FLU/RSV testing.  Fact Sheet for Patients: BloggerCourse.com  Fact Sheet for Healthcare Providers: SeriousBroker.it  This test is not yet approved or cleared by the [place] FDA and has been authorized for detection and/or diagnosis of SARS-CoV-2 by FDA under an Emergency Use Authorization (EUA). This EUA will remain in effect (meaning this test can be used) for the duration of the COVID-19 declaration under Section 564(b)(1) of the Act, 21 U.S.C. section 360bbb-3(b)(1), unless the authorization is terminated or revoked.  Performed at [facility], [address] [place], [place] [address]   Resp Panel by RT-PCR (Flu A&B, Covid) Anterior Nasal Swab     Status: None   Collection Time: 03/28/22  5:24 PM   Specimen: Anterior Nasal Swab  Result Value Ref Range Status   SARS Coronavirus 2 by RT PCR NEGATIVE NEGATIVE Final    Comment: (NOTE) SARS-CoV-2 target nucleic acids are NOT DETECTED.  The SARS-CoV-2  RNA is generally detectable in upper respiratory specimens during the acute phase of infection. The lowest concentration of SARS-CoV-2 viral copies this assay can detect is 138 copies/mL. A negative result does not preclude SARS-Cov-2 infection and should not be used as the sole basis for treatment or other patient management decisions. A negative result may occur with  improper specimen collection/handling, submission of specimen other than nasopharyngeal swab, presence of viral mutation(s) within the areas targeted by this assay, and inadequate number of viral copies(<138 copies/mL).  A negative result must be combined with clinical observations, patient history, and epidemiological information. The expected result is Negative.  Fact Sheet for Patients:  BloggerCourse.com  Fact Sheet for Healthcare Providers:  SeriousBroker.it  This test is no t yet approved or cleared by the [place] FDA and  has been authorized for detection and/or diagnosis of SARS-CoV-2 by FDA under an Emergency Use Authorization (EUA). This EUA will remain  in effect (meaning this test can be used) for the duration of the COVID-19 declaration under Section 564(b)(1) of the Act, 21 U.S.C.section 360bbb-3(b)(1), unless the authorization is terminated  or revoked sooner.       Influenza A by PCR NEGATIVE NEGATIVE Final   Influenza B by PCR NEGATIVE NEGATIVE Final    Comment: (NOTE) The Xpert Xpress SARS-CoV-2/FLU/RSV plus assay is intended as an aid in the diagnosis of influenza from Nasopharyngeal swab specimens and should not be used as a sole basis for treatment. Nasal washings and aspirates are unacceptable for Xpert Xpress SARS-CoV-2/FLU/RSV testing.  Fact Sheet for Patients: BloggerCourse.com  Fact Sheet for Healthcare Providers: SeriousBroker.it  This test is not yet approved or cleared by the  [place] FDA and has been authorized for detection and/or diagnosis of SARS-CoV-2 by FDA under an Emergency Use Authorization (EUA). This EUA will remain in effect (meaning this test can be used) for the duration of the COVID-19 declaration under Section 564(b)(1) of the Act, 21 U.S.C. section 360bbb-3(b)(1), unless the authorization is terminated or revoked.  Performed at [facility], [address], [place], [place] [address]   Respiratory (~20 pathogens) panel by PCR     Status: None   Collection Time: 03/28/22  5:24 PM   Specimen: Nasopharyngeal Swab; Respiratory  Result Value Ref Range Status   Adenovirus NOT DETECTED NOT DETECTED Final   Coronavirus 229E NOT DETECTED NOT DETECTED Final    Comment: (NOTE) The Coronavirus on the Respiratory Panel, DOES NOT test for the novel  Coronavirus (2019 nCoV)    Coronavirus HKU1 NOT DETECTED NOT DETECTED Final   Coronavirus NL63 NOT DETECTED NOT DETECTED Final   Coronavirus OC43 NOT DETECTED NOT DETECTED Final   Metapneumovirus NOT DETECTED NOT DETECTED Final   Rhinovirus / Enterovirus NOT DETECTED NOT DETECTED Final   Influenza A NOT DETECTED NOT DETECTED Final   Influenza B NOT DETECTED NOT DETECTED Final   Parainfluenza Virus 1 NOT DETECTED NOT DETECTED Final   Parainfluenza Virus 2 NOT DETECTED NOT DETECTED Final   Parainfluenza Virus 3 NOT DETECTED NOT DETECTED Final   Parainfluenza Virus 4 NOT DETECTED NOT DETECTED Final   Respiratory Syncytial Virus NOT DETECTED NOT DETECTED Final   Bordetella pertussis NOT DETECTED NOT DETECTED Final   Bordetella Parapertussis NOT DETECTED NOT DETECTED Final   Chlamydophila pneumoniae NOT DETECTED NOT DETECTED Final   Mycoplasma pneumoniae NOT DETECTED NOT DETECTED Final    Comment: Performed at [facility], [address] [place], [place] [address]     Time coordinating discharge:  0 minutes  SIGNED:   Dorcas Carrow, MD  [facility] 04/01/2022,  11:55 AM

## 2022-04-26 NOTE — L&D Delivery Note (Addendum)
OB/GYN Faculty Practice Delivery Note  Rachel Stanley is a 37 y.o. [id number] s/p SVD at Unknown. She was admitted for SOL.   ROM: 0h 47m with meconium stained fluid GBS Status: Unknown   Maximum Maternal Temperature: none gathered prior to delivery  Labor Progress: Initial SVE: complete, +1. She then progressed to complete.   Delivery Date/Time: [phone] @ 1634 Delivery: Patient presented via private vehicle and made contact in the car loop. Patient states no prenatal care but believed her due date was 02/17/23 and felt she was at term.  Denies hx of C/S, all vaginal deliveries.  Stated she was planning to have home birth but "family made me come." Was brought to MAU found to be complete and +1 so transitioned to L&D where head delivered LOA. No nuchal cord present. Shoulder and body delivered in usual fashion. Infant obviously preterm and without spontaneous cry, placed on mother's abdomen, dried and stimulated. Cord clamped x 2 immediately and cut by Dr. Judd Lien. Cord blood drawn and arterial cord gas drawn. Placenta delivered spontaneously with gentle cord traction. Fundus firm with massage and IM Pitocin. Labia, perineum, vagina, and cervix inspected without laceration requiring repair. Baby to NICU. Mom doing well.   Baby Weight: pending  Placenta: 3 vessel, intact. Sent to pathology Complications: None Lacerations: none EBL: 121 mL Analgesia: none   Infant:  APGAR (1 MIN):   APGAR (5 MINS):    Rachel Dibble, MD [facility] Family Medicine Fellow, [facility] for [facility], [facility] 02/14/2023, 4:32 PM

## 2022-06-07 ENCOUNTER — Other Ambulatory Visit: Payer: Self-pay

## 2022-06-07 ENCOUNTER — Emergency Department (HOSPITAL_COMMUNITY): Payer: Medicaid Other

## 2022-06-07 ENCOUNTER — Emergency Department (HOSPITAL_COMMUNITY)
Admission: EM | Admit: 2022-06-07 | Discharge: 2022-06-07 | Disposition: A | Discharge status: Home / Self Care | Payer: Medicaid Other | Attending: Emergency Medicine | Admitting: Emergency Medicine

## 2022-06-07 DIAGNOSIS — Z7951 Long term (current) use of inhaled steroids: Secondary | ICD-10-CM | POA: Insufficient documentation

## 2022-06-07 DIAGNOSIS — I10 Essential (primary) hypertension: Secondary | ICD-10-CM | POA: Diagnosis not present

## 2022-06-07 DIAGNOSIS — R062 Wheezing: Secondary | ICD-10-CM | POA: Diagnosis not present

## 2022-06-07 DIAGNOSIS — R000 Tachycardia, unspecified: Secondary | ICD-10-CM | POA: Diagnosis not present

## 2022-06-07 DIAGNOSIS — F1910 Other psychoactive substance abuse, uncomplicated: Secondary | ICD-10-CM | POA: Diagnosis not present

## 2022-06-07 DIAGNOSIS — J45909 Unspecified asthma, uncomplicated: Secondary | ICD-10-CM | POA: Insufficient documentation

## 2022-06-07 DIAGNOSIS — R0902 Hypoxemia: Secondary | ICD-10-CM | POA: Diagnosis not present

## 2022-06-07 DIAGNOSIS — F419 Anxiety disorder, unspecified: Secondary | ICD-10-CM | POA: Insufficient documentation

## 2022-06-07 DIAGNOSIS — R0682 Tachypnea, not elsewhere classified: Secondary | ICD-10-CM | POA: Diagnosis not present

## 2022-06-07 DIAGNOSIS — Z1152 Encounter for screening for COVID-19: Secondary | ICD-10-CM | POA: Insufficient documentation

## 2022-06-07 DIAGNOSIS — R0602 Shortness of breath: Secondary | ICD-10-CM | POA: Diagnosis not present

## 2022-06-07 DIAGNOSIS — J4521 Mild intermittent asthma with (acute) exacerbation: Secondary | ICD-10-CM | POA: Diagnosis not present

## 2022-06-07 DIAGNOSIS — R064 Hyperventilation: Secondary | ICD-10-CM | POA: Diagnosis not present

## 2022-06-07 DIAGNOSIS — R0689 Other abnormalities of breathing: Secondary | ICD-10-CM | POA: Diagnosis not present

## 2022-06-07 LAB — CBC WITH DIFFERENTIAL/PLATELET
Abs Immature Granulocytes: 0.01 10*3/uL (ref 0.00–0.07)
Basophils Absolute: 0 10*3/uL (ref 0.0–0.1)
Basophils Relative: 0 %
Eosinophils Absolute: 0.2 10*3/uL (ref 0.0–0.5)
Eosinophils Relative: 3 %
HCT: 38.6 % (ref 36.0–46.0)
Hemoglobin: 12.6 g/dL (ref 12.0–15.0)
Immature Granulocytes: 0 %
Lymphocytes Relative: 19 %
Lymphs Abs: 1.4 10*3/uL (ref 0.7–4.0)
MCH: 27.1 pg (ref 26.0–34.0)
MCHC: 32.6 g/dL (ref 30.0–36.0)
MCV: 83 fL (ref 80.0–100.0)
Monocytes Absolute: 0.7 10*3/uL (ref 0.1–1.0)
Monocytes Relative: 9 %
Neutro Abs: 5 10*3/uL (ref 1.7–7.7)
Neutrophils Relative %: 69 %
Platelets: 421 10*3/uL — ABNORMAL HIGH (ref 150–400)
RBC: 4.65 MIL/uL (ref 3.87–5.11)
RDW: 15.6 % — ABNORMAL HIGH (ref 11.5–15.5)
WBC: 7.3 10*3/uL (ref 4.0–10.5)
nRBC: 0 % (ref 0.0–0.2)

## 2022-06-07 LAB — BASIC METABOLIC PANEL
Anion gap: 8 (ref 5–15)
BUN: 9 mg/dL (ref 6–20)
CO2: 24 mmol/L (ref 22–32)
Calcium: 8.5 mg/dL — ABNORMAL LOW (ref 8.9–10.3)
Chloride: 105 mmol/L (ref 98–111)
Creatinine, Ser: 0.77 mg/dL (ref 0.44–1.00)
GFR, Estimated: >60 mL/min (ref >60)
Glucose, Bld: 131 mg/dL — ABNORMAL HIGH (ref 70–99)
Potassium: 3.9 mmol/L (ref 3.5–5.1)
Sodium: 137 mmol/L (ref 135–145)

## 2022-06-07 LAB — RESP PANEL BY RT-PCR (RSV, FLU A&B, COVID)  RVPGX2
Influenza A by PCR: NEGATIVE
Influenza B by PCR: NEGATIVE
Resp Syncytial Virus by PCR: NEGATIVE
SARS Coronavirus 2 by RT PCR: NEGATIVE

## 2022-06-07 MED ORDER — METHYLPREDNISOLONE SODIUM SUCC 125 MG IJ SOLR
125.0000 mg | Freq: Once | INTRAMUSCULAR | Status: AC
  Administered 2022-06-07: 125 mg via INTRAVENOUS
  Filled 2022-06-07: qty 2

## 2022-06-07 MED ORDER — MAGNESIUM SULFATE 2 GM/50ML IV SOLN
2.0000 g | Freq: Once | INTRAVENOUS | Status: AC
  Administered 2022-06-07: 2 g via INTRAVENOUS
  Filled 2022-06-07: qty 50

## 2022-06-07 MED ORDER — ALBUTEROL SULFATE (2.5 MG/3ML) 0.083% IN NEBU: 2.5000 mg | INHALATION_SOLUTION | RESPIRATORY_TRACT | Status: DC | PRN

## 2022-06-07 MED ORDER — ALBUTEROL SULFATE HFA 108 (90 BASE) MCG/ACT IN AERS: 1.0000 | INHALATION_SPRAY | Freq: Four times a day (QID) | RESPIRATORY_TRACT | 0 refills | Status: AC | PRN

## 2022-06-07 MED ORDER — SODIUM CHLORIDE 0.9 % IV BOLUS
1000.0000 mL | Freq: Once | INTRAVENOUS | Status: AC
  Administered 2022-06-07: 1000 mL via INTRAVENOUS

## 2022-06-07 NOTE — ED Notes (Signed)
DC instructions reviewed with pt. PT verbalized understanding. PT DC with 2 [employer]. Blue bird informed 1 was necessary from [facility] to [place], then from [place] to destination address.

## 2022-06-07 NOTE — ED Notes (Signed)
Pt ambulated  around in rm wo any difficulty

## 2022-06-07 NOTE — Discharge Instructions (Signed)
You have been seen today for your complaint of shortness of breath. Your lab work was reassuring and showed no abnormalities. Your imaging was reassuring and showed no abnormalities. Home care instructions are as follows:  Stop using cocaine.  Stop smoking marijuana.  Stop smoking anything at all.  Keep your rescue inhaler on you at all times and use it when needed. Follow up with: A primary care provider of your choice for reevaluation of your asthma symptoms Please seek immediate medical care if you develop any of the following symptoms: Your peak flow reading is less than 50% of your personal best. This is in the red zone, which means "danger." You develop chest pain or discomfort. Your medicines no longer seem to be helping. You are coughing up bloody mucus. You have a fever and your symptoms suddenly get worse. You have trouble swallowing. You feel very tired, and breathing becomes tiring. At this time there does not appear to be the presence of an emergent medical condition, however there is always the potential for conditions to change. Please read and follow the below instructions.  Do not take your medicine if  develop an itchy rash, swelling in your mouth or lips, or difficulty breathing; call 911 and seek immediate emergency medical attention if this occurs.  You may review your lab tests and imaging results in their entirety on your MyChart account.  Please discuss all results of fully with your primary care provider and other specialist at your follow-up visit.  Note: Portions of this text may have been transcribed using voice recognition software. Every effort was made to ensure accuracy; however, inadvertent computerized transcription errors may still be present.

## 2022-06-07 NOTE — ED Provider Notes (Signed)
[facility] Provider Note   CSN: [id number] Arrival date & time: 06/07/22  1103     History  Chief Complaint  Patient presents with   Shortness of Breath    Rachel Stanley is a 37 y.o. female.  With a history of asthma, polysubstance abuse who presents to the ED for evaluation of shortness of breath.  She states she began to develop upper respiratory infection symptoms including cough productive of whitish sputum, congestion and rhinorrhea yesterday morning.  Yesterday evening she smoked crack cocaine and states that it made the symptoms significantly worse.  She went back to her hotel room this afternoon to use her inhaler, but could not make it to her room.  She stopped in the lobby and asked them to call an ambulance for her.  EMS gave patient a DuoNeb which she reports made her feel significantly better.  She currently states that she has very minimal shortness of breath.  She denies chest pain, dizziness, lightheadedness, lower extremity swelling, hemoptysis, abdominal pain, nausea, vomiting.  Reports social alcohol use but has not drank in more than 1 week.  She smokes marijuana almost every day.  Denies other recreational drug use.  She states last night was a relapse that she has not used cocaine in quite a while before that.  EMS reports the patient was 88% on room air with a respiratory rate of 30 breaths/min.   Shortness of Breath Associated symptoms: cough        Home Medications Prior to Admission medications   Medication Sig Start Date End Date Taking? Authorizing Provider  albuterol (VENTOLIN HFA) 108 (90 Base) MCG/ACT inhaler Inhale 1-2 puffs into the lungs every 6 (six) hours as needed for wheezing or shortness of breath. 06/07/22  Yes Austyn Perriello, Grafton Folk, PA-C  guaiFENesin (MUCINEX) 600 MG 12 hr tablet Take 600 mg by mouth 2 (two) times daily as needed for cough or to loosen phlegm.    [provider]  ibuprofen  (ADVIL) 200 MG tablet Take 400 mg by mouth every 6 (six) hours as needed for mild pain.    [provider]  Pseudoephedrine-APAP-DM (DAYQUIL MULTI-SYMPTOM COLD/FLU PO) Take 2 capsules by mouth every 6 (six) hours as needed (flu symptoms).    [provider]      Allergies    Nitrofurantoin and Penicillins    Review of Systems   Review of Systems  Respiratory:  Positive for cough and shortness of breath.   All other systems reviewed and are negative.   Physical Exam Updated Vital Signs BP (!) 141/82   Pulse 87   Temp 98.7 F (37.1 C) (Oral)   Resp 18   Ht 5' 3"$  (1.6 m)   Wt 59 kg   SpO2 100%   BMI 23.03 kg/m  Physical Exam Vitals and nursing note reviewed.  Constitutional:      General: She is not in acute distress.    Appearance: She is well-developed. She is not ill-appearing, toxic-appearing or diaphoretic.  HENT:     Head: Normocephalic and atraumatic.  Eyes:     Conjunctiva/sclera: Conjunctivae normal.  Cardiovascular:     Rate and Rhythm: Regular rhythm. Tachycardia present.     Heart sounds: No murmur heard. Pulmonary:     Effort: Pulmonary effort is normal. Tachypnea present.     Breath sounds: Normal breath sounds. No decreased breath sounds, wheezing, rhonchi or rales.  Abdominal:     Palpations: Abdomen  is soft.     Tenderness: There is no abdominal tenderness.  Musculoskeletal:        General: No swelling.     Cervical back: Neck supple.     Right lower leg: No edema.     Left lower leg: No edema.  Skin:    General: Skin is warm and dry.     Capillary Refill: Capillary refill takes less than 2 seconds.     Coloration: Skin is not cyanotic.  Neurological:     General: No focal deficit present.     Mental Status: She is alert and oriented to person, place, and time.  Psychiatric:        Mood and Affect: Mood is anxious.     ED Results / Procedures / Treatments   Labs (all labs ordered are listed, but only abnormal results are  displayed) Labs Reviewed  BASIC METABOLIC PANEL - Abnormal; Notable for the following components:      Result Value   Glucose, Bld 131 (*)    Calcium 8.5 (*)    All other components within normal limits  CBC WITH DIFFERENTIAL/PLATELET - Abnormal; Notable for the following components:   RDW 15.6 (*)    Platelets 421 (*)    All other components within normal limits  RESP PANEL BY RT-PCR (RSV, FLU A&B, COVID)  [id number]    EKG None  Radiology DG Chest 2 View  Result Date: 06/07/2022 CLINICAL DATA:  Shortness of breath. EXAM: CHEST - 2 VIEW COMPARISON:  03/30/2022 FINDINGS: The heart size and mediastinal contours are within normal limits. Both lungs are clear. The visualized skeletal structures are unremarkable. IMPRESSION: No active cardiopulmonary disease. Electronically Signed   By: Nolon Nations M.D.   On: 06/07/2022 11:51    Procedures Procedures    Medications Ordered in ED Medications  albuterol (PROVENTIL) (2.5 MG/3ML) 0.083% nebulizer solution 2.5 mg (has no administration in time range)  methylPREDNISolone sodium succinate (SOLU-MEDROL) 125 mg/2 mL injection 125 mg (125 mg Intravenous Given 06/07/22 1230)  magnesium sulfate IVPB 2 g 50 mL (0 g Intravenous Stopped 06/07/22 1353)  sodium chloride 0.9 % bolus 1,000 mL (1,000 mLs Intravenous New Bag/Given 06/07/22 1310)    ED Course/ Medical Decision Making/ A&P Clinical Course as of 06/07/22 1444  Mon Jun 07, 2022  1155 DG Chest 2 View I personally reviewed and interpreted the image.  No acute cardiopulmonary abnormalities. [AS]  [id number] On reevaluation, patient states she feels significantly better.  He still borderline tachycardic.  States she has a dry mouth and has not been drinking much lately.  Will give IV fluids and reassess. [AS]    Clinical Course User Index [AS] Finn Altemose, Grafton Folk, PA-C                             Medical Decision Making Amount and/or Complexity of Data Reviewed Labs: ordered. Radiology:  ordered. Decision-making details documented in ED Course.  Risk Prescription drug management.  This patient presents to the ED for concern of shortness of breath, this involves an extensive number of treatment options, and is a complaint that carries with it a high risk of complications and morbidity.  The emergent differential diagnosis for shortness of breath includes, but is not limited to, Pulmonary edema, bronchoconstriction, Pneumonia, Pulmonary embolism, Pneumotherax/ Hemothorax, Dysrythmia, ACS, COPD exacerbation, bronchitis, asthma exacerbation  Co morbidities that complicate the patient evaluation   asthma, polysubstance abuse  My initial  workup includes basic labs, chest x-ray, Solu-Medrol, magnesium  Additional history obtained from: Nursing notes from this visit. Previous records within EMR system ED visit for same on 03/28/2022 EMS provides a portion of the history  I ordered, reviewed and interpreted labs which include: BMP, CBC, respiratory panel.  Labs unremarkable.  No leukocytosis or anemia.  I ordered imaging studies including chest x-ray I independently visualized and interpreted imaging which showed normal I agree with the radiologist interpretation  Cardiac Monitoring:  The patient was maintained on a cardiac monitor.  I personally viewed and interpreted the cardiac monitored which showed an underlying rhythm of: Initially sinus tachycardia, improved to NSR after fluids  Afebrile, initially slightly tachycardic and tachypneic.  These did improved arrival.  Otherwise hemodynamically stable.  37 year old female presenting to the ED for evaluation of shortness of breath after ingesting crack cocaine.  Has a history of asthma but was unable to take her rescue inhaler as she was staying at a hotel and her inhaler was at home.  On arrival, she appeared anxious and had mild tachypnea but had clear lung sounds and had 100% pulse ox on room air.  She did have a DuoNeb with  EMS.  She was treated with magnesium and Solu-Medrol for asthma exacerbation although this is likely bronchospasm secondary to crack cocaine use.  PE was considered but is found to be less likely in the setting due to improvement in her symptoms after DuoNeb and overall hemodynamic stability.  She did ambulate in the ED and was stable the entire time.  She states she does have a full albuterol inhaler at home, but does not have a refill.  Refill will be sent and patient was encouraged to follow-up with her primary care provider for further evaluation.  She was encouraged to stop using crack cocaine, marijuana or other inhaled substances.  She was encouraged to stop using all recreational drugs.  She was given return precautions.  Stable at discharge.  At this time there does not appear to be any evidence of an acute emergency medical condition and the patient appears stable for discharge with appropriate outpatient follow up. Diagnosis was discussed with patient who verbalizes understanding of care plan and is agreeable to discharge. I have discussed return precautions with patient who verbalizes understanding. Patient encouraged to follow-up with their PCP within 1 week. All questions answered.  Patient's case discussed with Dr. Vanita Panda who agrees with plan to discharge with follow-up.   Note: Portions of this report may have been transcribed using voice recognition software. Every effort was made to ensure accuracy; however, inadvertent computerized transcription errors may still be present.        Final Clinical Impression(s) / ED Diagnoses Final diagnoses:  Mild intermittent asthma with exacerbation  Polysubstance abuse [facility])    Rx / DC Orders ED Discharge Orders          Ordered    Check Pulse Oximetry while ambulating        06/07/22 1358    albuterol (VENTOLIN HFA) 108 (90 Base) MCG/ACT inhaler  Every 6 hours PRN        06/07/22 [address] Evelena Leyden 06/07/22 1445    Carmin Muskrat, MD 06/08/22 [phone]

## 2022-06-07 NOTE — ED Triage Notes (Addendum)
Pt bib EMS from hotel lobby with c/c SOB.  Per EMS, pt walked into hotel lobby to call 911.  FD arrived to hotel, RR 30+, 88% on RA.  EMS reports wheezing, hx of asthma, duoneb given, appears anxious, sick 2 days, coughing up green mucus, chest discomfort, pain in legs, afebrile, allergic to penicillin.  VS with EMS:  170/100, HR 110, RR 26, SpO2 100 RA.  Pt reports smoked crack yesterday, states she feels worse since then.

## 2022-10-21 ENCOUNTER — Ambulatory Visit: Payer: Self-pay

## 2023-02-14 ENCOUNTER — Other Ambulatory Visit: Payer: Self-pay

## 2023-02-14 ENCOUNTER — Encounter (HOSPITAL_COMMUNITY): Payer: Self-pay

## 2023-02-14 ENCOUNTER — Inpatient Hospital Stay (HOSPITAL_COMMUNITY)
Admission: AD | Admit: 2023-02-14 | Discharge: 2023-02-17 | DRG: 805 | Disposition: A | Discharge status: Home / Self Care | Payer: 59 | Attending: Obstetrics & Gynecology | Admitting: Obstetrics & Gynecology

## 2023-02-14 DIAGNOSIS — O26893 Other specified pregnancy related conditions, third trimester: Secondary | ICD-10-CM | POA: Diagnosis present

## 2023-02-14 DIAGNOSIS — Z87891 Personal history of nicotine dependence: Secondary | ICD-10-CM

## 2023-02-14 DIAGNOSIS — Z8249 Family history of ischemic heart disease and other diseases of the circulatory system: Secondary | ICD-10-CM | POA: Diagnosis not present

## 2023-02-14 DIAGNOSIS — A539 Syphilis, unspecified: Secondary | ICD-10-CM | POA: Diagnosis present

## 2023-02-14 DIAGNOSIS — O9832 Other infections with a predominantly sexual mode of transmission complicating childbirth: Secondary | ICD-10-CM | POA: Diagnosis present

## 2023-02-14 DIAGNOSIS — O99323 Drug use complicating pregnancy, third trimester: Principal | ICD-10-CM

## 2023-02-14 DIAGNOSIS — Z30017 Encounter for initial prescription of implantable subdermal contraceptive: Secondary | ICD-10-CM | POA: Diagnosis not present

## 2023-02-14 DIAGNOSIS — A568 Sexually transmitted chlamydial infection of other sites: Secondary | ICD-10-CM | POA: Diagnosis present

## 2023-02-14 DIAGNOSIS — Z880 Allergy status to penicillin: Secondary | ICD-10-CM | POA: Diagnosis not present

## 2023-02-14 DIAGNOSIS — O1414 Severe pre-eclampsia complicating childbirth: Secondary | ICD-10-CM | POA: Diagnosis present

## 2023-02-14 DIAGNOSIS — O9812 Syphilis complicating childbirth: Secondary | ICD-10-CM | POA: Diagnosis present

## 2023-02-14 DIAGNOSIS — F1410 Cocaine abuse, uncomplicated: Principal | ICD-10-CM | POA: Diagnosis present

## 2023-02-14 DIAGNOSIS — O0933 Supervision of pregnancy with insufficient antenatal care, third trimester: Secondary | ICD-10-CM | POA: Diagnosis not present

## 2023-02-14 DIAGNOSIS — Z3490 Encounter for supervision of normal pregnancy, unspecified, unspecified trimester: Principal | ICD-10-CM

## 2023-02-14 DIAGNOSIS — O99324 Drug use complicating childbirth: Secondary | ICD-10-CM | POA: Diagnosis present

## 2023-02-14 DIAGNOSIS — Z3A00 Weeks of gestation of pregnancy not specified: Secondary | ICD-10-CM | POA: Diagnosis not present

## 2023-02-14 DIAGNOSIS — O368130 Decreased fetal movements, third trimester, not applicable or unspecified: Secondary | ICD-10-CM | POA: Diagnosis not present

## 2023-02-14 DIAGNOSIS — O4202 Full-term premature rupture of membranes, onset of labor within 24 hours of rupture: Secondary | ICD-10-CM | POA: Diagnosis not present

## 2023-02-14 DIAGNOSIS — O411230 Chorioamnionitis, third trimester, not applicable or unspecified: Secondary | ICD-10-CM | POA: Diagnosis present

## 2023-02-14 DIAGNOSIS — Z881 Allergy status to other antibiotic agents status: Secondary | ICD-10-CM

## 2023-02-14 DIAGNOSIS — A749 Chlamydial infection, unspecified: Secondary | ICD-10-CM | POA: Diagnosis present

## 2023-02-14 HISTORY — DX: Cocaine use, unspecified, uncomplicated: F14.90

## 2023-02-14 LAB — CBC
HCT: 37.1 % (ref 36.0–46.0)
Hemoglobin: 12.1 g/dL (ref 12.0–15.0)
MCH: 26 pg (ref 26.0–34.0)
MCHC: 32.6 g/dL (ref 30.0–36.0)
MCV: 79.6 fL — ABNORMAL LOW (ref 80.0–100.0)
Platelets: 271 10*3/uL (ref 150–400)
RBC: 4.66 MIL/uL (ref 3.87–5.11)
RDW: 14.3 % (ref 11.5–15.5)
WBC: 9.9 10*3/uL (ref 4.0–10.5)
nRBC: 0.2 % (ref 0.0–0.2)

## 2023-02-14 LAB — COMPREHENSIVE METABOLIC PANEL
ALT: 16 U/L (ref 0–44)
AST: 23 U/L (ref 15–41)
Albumin: 2.1 g/dL — ABNORMAL LOW (ref 3.5–5.0)
Alkaline Phosphatase: 249 U/L — ABNORMAL HIGH (ref 38–126)
Anion gap: 11 (ref 5–15)
BUN: <5 mg/dL — ABNORMAL LOW (ref 6–20)
CO2: 20 mmol/L — ABNORMAL LOW (ref 22–32)
Calcium: 8.3 mg/dL — ABNORMAL LOW (ref 8.9–10.3)
Chloride: 101 mmol/L (ref 98–111)
Creatinine, Ser: 0.89 mg/dL (ref 0.44–1.00)
GFR, Estimated: >60 mL/min (ref >60)
Glucose, Bld: 106 mg/dL — ABNORMAL HIGH (ref 70–99)
Potassium: 3.6 mmol/L (ref 3.5–5.1)
Sodium: 132 mmol/L — ABNORMAL LOW (ref 135–145)
Total Bilirubin: 0.4 mg/dL (ref 0.3–1.2)
Total Protein: 7 g/dL (ref 6.5–8.1)

## 2023-02-14 LAB — HEMOGLOBIN A1C
Hgb A1c MFr Bld: 5.5 % (ref 4.8–5.6)
Mean Plasma Glucose: 111.15 mg/dL

## 2023-02-14 LAB — HEPATITIS B SURFACE ANTIGEN: Hepatitis B Surface Ag: NONREACTIVE

## 2023-02-14 LAB — RAPID URINE DRUG SCREEN, HOSP PERFORMED
Amphetamines: NOT DETECTED
Barbiturates: NOT DETECTED
Benzodiazepines: NOT DETECTED
Cocaine: POSITIVE — AB
Opiates: NOT DETECTED
Tetrahydrocannabinol: NOT DETECTED

## 2023-02-14 LAB — TYPE AND SCREEN
ABO/RH(D): A POS
Antibody Screen: NEGATIVE

## 2023-02-14 LAB — HEPATITIS B CORE ANTIBODY, IGM: Hep B C IgM: NONREACTIVE

## 2023-02-14 LAB — HEPATITIS C ANTIBODY: HCV Ab: NONREACTIVE

## 2023-02-14 LAB — HIV ANTIBODY (ROUTINE TESTING W REFLEX): HIV Screen 4th Generation wRfx: NONREACTIVE

## 2023-02-14 LAB — GROUP B STREP BY PCR: Group B strep by PCR: NEGATIVE

## 2023-02-14 MED ORDER — SIMETHICONE 80 MG PO CHEW: 80.0000 mg | CHEWABLE_TABLET | ORAL | Status: DC | PRN

## 2023-02-14 MED ORDER — MAGNESIUM SULFATE 40 GM/1000ML IV SOLN
2.0000 g/h | INTRAVENOUS | Status: AC
  Administered 2023-02-14: 2 g/h via INTRAVENOUS
  Filled 2023-02-14: qty 1000

## 2023-02-14 MED ORDER — WITCH HAZEL-GLYCERIN EX PADS: 1.0000 | MEDICATED_PAD | CUTANEOUS | Status: DC | PRN

## 2023-02-14 MED ORDER — FLEET ENEMA RE ENEM: 1.0000 | ENEMA | RECTAL | Status: DC | PRN

## 2023-02-14 MED ORDER — OXYCODONE-ACETAMINOPHEN 5-325 MG PO TABS: 2.0000 | ORAL_TABLET | ORAL | Status: DC | PRN

## 2023-02-14 MED ORDER — SOD CITRATE-CITRIC ACID 500-334 MG/5ML PO SOLN: 30.0000 mL | ORAL | Status: DC | PRN

## 2023-02-14 MED ORDER — PRENATAL MULTIVITAMIN CH
1.0000 | ORAL_TABLET | Freq: Every day | ORAL | Status: DC
  Administered 2023-02-16: 1 via ORAL
  Filled 2023-02-14: qty 1

## 2023-02-14 MED ORDER — LABETALOL HCL 5 MG/ML IV SOLN
20.0000 mg | INTRAVENOUS | Status: DC | PRN
  Administered 2023-02-14: 20 mg via INTRAVENOUS
  Filled 2023-02-14: qty 4

## 2023-02-14 MED ORDER — ACETAMINOPHEN 325 MG PO TABS: 650.0000 mg | ORAL_TABLET | ORAL | Status: DC | PRN

## 2023-02-14 MED ORDER — CEFAZOLIN SODIUM-DEXTROSE 2-4 GM/100ML-% IV SOLN
2.0000 g | Freq: Three times a day (TID) | INTRAVENOUS | Status: AC
  Administered 2023-02-14 – 2023-02-15 (×3): 2 g via INTRAVENOUS
  Filled 2023-02-14 (×3): qty 100

## 2023-02-14 MED ORDER — ACETAMINOPHEN 325 MG PO TABS
650.0000 mg | ORAL_TABLET | ORAL | Status: DC | PRN
  Administered 2023-02-16 (×3): 650 mg via ORAL
  Filled 2023-02-14 (×3): qty 2

## 2023-02-14 MED ORDER — OXYTOCIN 10 UNIT/ML IJ SOLN
10.0000 [IU] | Freq: Once | INTRAMUSCULAR | Status: AC
Start: 2023-02-14 — End: 2023-02-14
  Administered 2023-02-14: 10 [IU] via INTRAMUSCULAR

## 2023-02-14 MED ORDER — VANCOMYCIN HCL 10 G IV SOLR: 20.0000 mg/kg | Freq: Two times a day (BID) | INTRAVENOUS | Status: DC

## 2023-02-14 MED ORDER — LACTATED RINGERS IV SOLN: INTRAVENOUS | Status: DC

## 2023-02-14 MED ORDER — IBUPROFEN 800 MG PO TABS
800.0000 mg | ORAL_TABLET | Freq: Four times a day (QID) | ORAL | Status: DC | PRN
  Administered 2023-02-14: 800 mg via ORAL
  Filled 2023-02-14: qty 1

## 2023-02-14 MED ORDER — MEASLES, MUMPS & RUBELLA VAC IJ SOLR: 0.5000 mL | Freq: Once | INTRAMUSCULAR | Status: DC

## 2023-02-14 MED ORDER — COCONUT OIL OIL: 1.0000 | TOPICAL_OIL | Status: DC | PRN

## 2023-02-14 MED ORDER — CEFAZOLIN SODIUM-DEXTROSE 2-4 GM/100ML-% IV SOLN
2.0000 g | Freq: Three times a day (TID) | INTRAVENOUS | Status: DC
  Filled 2023-02-14: qty 100

## 2023-02-14 MED ORDER — SENNOSIDES-DOCUSATE SODIUM 8.6-50 MG PO TABS
2.0000 | ORAL_TABLET | ORAL | Status: DC
  Administered 2023-02-15 – 2023-02-17 (×2): 2 via ORAL
  Filled 2023-02-14 (×2): qty 2

## 2023-02-14 MED ORDER — OXYTOCIN-SODIUM CHLORIDE 30-0.9 UT/500ML-% IV SOLN: 2.5000 [IU]/h | INTRAVENOUS | Status: DC

## 2023-02-14 MED ORDER — FUROSEMIDE 20 MG PO TABS
20.0000 mg | ORAL_TABLET | Freq: Every day | ORAL | Status: DC
  Administered 2023-02-15 – 2023-02-17 (×3): 20 mg via ORAL
  Filled 2023-02-14 (×3): qty 1

## 2023-02-14 MED ORDER — LABETALOL HCL 5 MG/ML IV SOLN: 80.0000 mg | INTRAVENOUS | Status: DC | PRN

## 2023-02-14 MED ORDER — IBUPROFEN 600 MG PO TABS
600.0000 mg | ORAL_TABLET | Freq: Four times a day (QID) | ORAL | Status: DC
  Administered 2023-02-14 – 2023-02-17 (×10): 600 mg via ORAL
  Filled 2023-02-14 (×10): qty 1

## 2023-02-14 MED ORDER — OXYTOCIN BOLUS FROM INFUSION: 333.0000 mL | Freq: Once | INTRAVENOUS | Status: DC

## 2023-02-14 MED ORDER — SODIUM CHLORIDE 0.9% FLUSH: 10.0000 mL | Freq: Two times a day (BID) | INTRAVENOUS | Status: DC

## 2023-02-14 MED ORDER — MAGNESIUM SULFATE 40 GM/1000ML IV SOLN
2.0000 g/h | INTRAVENOUS | Status: DC
  Filled 2023-02-14: qty 1000

## 2023-02-14 MED ORDER — ONDANSETRON HCL 4 MG PO TABS: 4.0000 mg | ORAL_TABLET | ORAL | Status: DC | PRN

## 2023-02-14 MED ORDER — LIDOCAINE HCL (PF) 1 % IJ SOLN: 30.0000 mL | INTRAMUSCULAR | Status: DC | PRN

## 2023-02-14 MED ORDER — NIFEDIPINE ER OSMOTIC RELEASE 30 MG PO TB24
30.0000 mg | ORAL_TABLET | Freq: Every day | ORAL | Status: DC
  Administered 2023-02-14 – 2023-02-17 (×4): 30 mg via ORAL
  Filled 2023-02-14 (×4): qty 1

## 2023-02-14 MED ORDER — ONDANSETRON HCL 4 MG/2ML IJ SOLN
4.0000 mg | INTRAMUSCULAR | Status: DC | PRN
Start: 2023-02-14 — End: 2023-02-17

## 2023-02-14 MED ORDER — ZOLPIDEM TARTRATE 5 MG PO TABS
5.0000 mg | ORAL_TABLET | Freq: Every evening | ORAL | Status: DC | PRN
Start: 2023-02-14 — End: 2023-02-17
  Filled 2023-02-14: qty 1

## 2023-02-14 MED ORDER — GENTAMICIN SULFATE 40 MG/ML IJ SOLN
5.0000 mg/kg | Freq: Once | INTRAVENOUS | Status: AC
  Administered 2023-02-14: 280 mg via INTRAVENOUS
  Filled 2023-02-14: qty 7

## 2023-02-14 MED ORDER — HYDRALAZINE HCL 20 MG/ML IJ SOLN: 10.0000 mg | INTRAMUSCULAR | Status: DC | PRN

## 2023-02-14 MED ORDER — DIPHENHYDRAMINE HCL 25 MG PO CAPS: 25.0000 mg | ORAL_CAPSULE | Freq: Four times a day (QID) | ORAL | Status: DC | PRN

## 2023-02-14 MED ORDER — LABETALOL HCL 5 MG/ML IV SOLN: 40.0000 mg | INTRAVENOUS | Status: DC | PRN

## 2023-02-14 MED ORDER — SODIUM CHLORIDE 0.9% FLUSH: 3.0000 mL | Freq: Two times a day (BID) | INTRAVENOUS | Status: DC

## 2023-02-14 MED ORDER — DIBUCAINE (PERIANAL) 1 % EX OINT: 1.0000 | TOPICAL_OINTMENT | CUTANEOUS | Status: DC | PRN

## 2023-02-14 MED ORDER — OXYCODONE-ACETAMINOPHEN 5-325 MG PO TABS: 1.0000 | ORAL_TABLET | ORAL | Status: DC | PRN

## 2023-02-14 MED ORDER — SODIUM CHLORIDE 0.9% FLUSH: 3.0000 mL | INTRAVENOUS | Status: DC | PRN

## 2023-02-14 MED ORDER — TETANUS-DIPHTH-ACELL PERTUSSIS 5-2.5-18.5 LF-MCG/0.5 IM SUSY: 0.5000 mL | PREFILLED_SYRINGE | Freq: Once | INTRAMUSCULAR | Status: DC

## 2023-02-14 MED ORDER — ONDANSETRON HCL 4 MG/2ML IJ SOLN: 4.0000 mg | Freq: Four times a day (QID) | INTRAMUSCULAR | Status: DC | PRN

## 2023-02-14 MED ORDER — OXYTOCIN 10 UNIT/ML IJ SOLN
INTRAMUSCULAR | Status: AC
  Filled 2023-02-14: qty 1

## 2023-02-14 MED ORDER — BENZOCAINE-MENTHOL 20-0.5 % EX AERO: 1.0000 | INHALATION_SPRAY | CUTANEOUS | Status: DC | PRN

## 2023-02-14 MED ORDER — MAGNESIUM SULFATE BOLUS VIA INFUSION
4.0000 g | Freq: Once | INTRAVENOUS | Status: AC
  Administered 2023-02-14: 4 g via INTRAVENOUS
  Filled 2023-02-14: qty 1000

## 2023-02-14 MED ORDER — LACTATED RINGERS IV SOLN
500.0000 mL | INTRAVENOUS | Status: DC | PRN
  Administered 2023-02-14: 500 mL via INTRAVENOUS

## 2023-02-14 NOTE — H&P (Signed)
OBSTETRIC ADMISSION HISTORY AND PHYSICAL  Rachel Stanley is a 37 y.o. female [phone] with IUP at Unknown gestational age presenting for SOL after SROM 1630 10/20 while attempting a repeat home birth. Friends and family drove her to the hospital and pt assisted out of the car 2/2 patient painful contractions and brought to the MAU found to be complete and plus 1. She reports decreased FMs, LOF (clear), but no VB currently, no blurry vision, headaches or peripheral edema, and RUQ pain.  She states that she had one "small episode of bleeding yesterday" but was upset when family drove her to the hospital but agreeable to treatment today. She plans on breast feeding. She's unsure for birth control. She reports not receiving any prenatal care this pregnancy.   Prenatal History/Complications:  Patient Active Problem List   Diagnosis Date Noted   Pregnancy 02/14/2023   Acute respiratory failure with hypoxia (HCC) 03/28/2022   Hypokalemia 03/28/2022   Tachycardia 03/28/2022   Cocaine abuse (HCC) 03/28/2022   Asthma exacerbation 03/28/2022   Tobacco use 03/28/2022   Indication for care in labor or delivery 01/14/2019  Lack of prenatal care  Past Medical History: Past Medical History:  Diagnosis Date   Asthma    Medical history non-contributory     Past Surgical History: Past Surgical History:  Procedure Laterality Date   NO PAST SURGERIES      Obstetrical History: OB History     Gravida  7   Para  6   Term  3   Preterm  3   AB      Living  5      SAB      IAB      Ectopic      Multiple  0   Live Births  6           Social History Social History   Socioeconomic History   Marital status: Single    Spouse name: Not on file   Number of children: Not on file   Years of education: Not on file   Highest education level: Not on file  Occupational History   Not on file  Tobacco Use   Smoking status: Some Days    Types: Cigarettes   Smokeless tobacco: Former   Substance and Sexual Activity   Alcohol use: Not Currently   Drug use: Not Currently    Types: Marijuana, "Crack" cocaine    Comment: clast crack/cocaine was 3.5-4 months, marijuanna occasionally,,   Sexual activity: Yes    Birth control/protection: Injection  Other Topics Concern   Not on file  Social History Narrative   Not on file   Social Determinants of Health   Financial Resource Strain: Not on file  Food Insecurity: No Food Insecurity (03/30/2022)   Hunger Vital Sign    Worried About Running Out of Food in the Last Year: Never true    Ran Out of Food in the Last Year: Never true  Transportation Needs: No Transportation Needs (03/30/2022)   PRAPARE - Administrator, Civil Service (Medical): No    Lack of Transportation (Non-Medical): No  Physical Activity: Not on file  Stress: Not on file  Social Connections: Not on file    Family History: Family History  Problem Relation Age of Onset   Heart disease Mother    Heart disease Father     Allergies: Allergies  Allergen Reactions   Nitrofurantoin Hives and Nausea And Vomiting   Penicillins Hives  States she was young but what she remembers is swelling and fever    Medications Prior to Admission  Medication Sig Dispense Refill Last Dose   albuterol (VENTOLIN HFA) 108 (90 Base) MCG/ACT inhaler Inhale 1-2 puffs into the lungs every 6 (six) hours as needed for wheezing or shortness of breath. 1 each 0    guaiFENesin (MUCINEX) 600 MG 12 hr tablet Take 600 mg by mouth 2 (two) times daily as needed for cough or to loosen phlegm.      ibuprofen (ADVIL) 200 MG tablet Take 400 mg by mouth every 6 (six) hours as needed for mild pain.      Pseudoephedrine-APAP-DM (DAYQUIL MULTI-SYMPTOM COLD/FLU PO) Take 2 capsules by mouth every 6 (six) hours as needed (flu symptoms).        Review of Systems   All systems reviewed and negative except as stated in HPI  There were no vitals taken for this visit. General  appearance: alert, appears stated age, mild distress, and appears to be under the influence of unknown substance Lungs: clear to auscultation bilaterally Heart: regular rate and rhythm Abdomen: soft, non-tender; bowel sounds normal Pelvic: malodorous but normal appearing female genitalia  Extremities: Homans sign is negative, no sign of DVT Presentation: cephalic Fetal monitoringBaseline: 150 bpm, Variability: Good {> 6 bpm), Accelerations: Reactive, Decelerations: Absent, and limited 2/2 short time between monitoring and delivery  Uterine activity q58minutes      Prenatal labs: (no prenatal care, labs pending on admission) ABO, Rh:   Antibody:   Rubella:   RPR:    HBsAg:    HIV: Non Reactive (12/04 0245)  GBS:    A1c pending on admission  Genetic screening  not done Anatomy US not done  Prenatal Transfer Tool  Maternal Diabetes: unknown Genetic Screening: unknown Maternal Ultrasounds/Referrals: not done Fetal Ultrasounds or other Referrals:  not done Maternal Substance Abuse:  Yes:  Type: Cocaine Significant Maternal Medications:  Meds include: Other: Dayquil, Ibuprofen, Mucinex Significant Maternal Lab Results:  Other: GBS not done, delivered prior to potential ppx Number of Prenatal Visits:Less than or equal to 3 verified prenatal visits Other Comments:   No prenatal care  No results found for this or any previous visit (from the past 24 hour(s)).  Patient Active Problem List   Diagnosis Date Noted   Pregnancy 02/14/2023   Acute respiratory failure with hypoxia (HCC) 03/28/2022   Hypokalemia 03/28/2022   Tachycardia 03/28/2022   Cocaine abuse (HCC) 03/28/2022   Asthma exacerbation 03/28/2022   Tobacco use 03/28/2022   Indication for care in labor or delivery 01/14/2019    Assessment/Plan:  Rachel Stanley is a 37 y.o. [id number] at Unknown here for SOL:  #Labor:expectant management #Pain: Per patient request #FWB: No full NST but reassuring heart tones noted on  limited strip #ID:  unknown #MOF: breast #MOC:unsure #Circ:  No  #hx of Cocaine use #No prenatal care  Hessie Dibble, MD  02/14/2023, 4:54 PM

## 2023-02-14 NOTE — Discharge Summary (Signed)
Postpartum Discharge Summary  Date of Service updated***     Patient Name: Rachel Stanley DOB: March 17, 1986 MRN: [id number]  Date of admission: 02/14/2023 Delivery date:02/14/2023 Delivering provider: CHUBB, CASEY C Date of discharge: 02/14/2023  Admitting diagnosis: Pregnancy [Z34.90] Intrauterine pregnancy: Unknown     Secondary diagnosis:  Principal Problem:   Pregnancy  Additional problems: No prenatal care, presumptive chorioamnionitis  Discharge diagnosis: Preterm Pregnancy Delivered                                              Post partum procedures:{Postpartum procedures:23558} Augmentation: N/A Complications: None  Hospital course: Onset of Labor With Vaginal Delivery      37 y.o. yo [id number] at Unknown was admitted in Active Labor on 02/14/2023. Labor course was complicated by no prenatal care, meconium stained fluids, delivery of preterm infant.   Membrane Rupture Time/Date: 3:55 PM,02/14/2023  Delivery Method:Vaginal, Spontaneous Operative Delivery:N/A Episiotomy: None Lacerations:    Patient had a postpartum course complicated by ***.  She is ambulating, tolerating a regular diet, passing flatus, and urinating well. Patient is discharged home in stable condition on 02/14/23.  Newborn Data: Birth date:02/14/2023 Birth time:3:58 PM Gender:Female Living status:Living Apgars:5 ,9  Weight:1920 g  Magnesium Sulfate received: {Mag received:30440022} BMZ received: No Rhophylac:N/A MMR:{MMR:30440033}ordered  T-DaP:{Tdap:23962}ordered Flu: {ZHY:86578} RSV Vaccine received: {RSV:31013} Transfusion:{Transfusion received:30440034}  Immunizations received: Immunization History  Administered Date(s) Administered   Influenza,inj,Quad PF,6+ Mos 01/16/2019    Physical exam  There were no vitals filed for this visit. General: {Exam; general:21111117} Lochia: {Desc; appropriate/inappropriate:30686::"appropriate"} Uterine Fundus: {Desc; firm/soft:30687} Incision:  {Exam; incision:21111123} DVT Evaluation: {Exam; ION:6295284} Labs: Lab Results  Component Value Date   WBC 7.3 06/07/2022   HGB 12.6 06/07/2022   HCT 38.6 06/07/2022   MCV 83.0 06/07/2022   PLT 421 (H) 06/07/2022      Latest Ref Rng & Units 06/07/2022   11:12 AM  CMP  Glucose 70 - 99 mg/dL 132   BUN 6 - 20 mg/dL 9   Creatinine 4.40 - 1.02 mg/dL 7.25   Sodium 366 - 440 mmol/L 137   Potassium 3.5 - 5.1 mmol/L 3.9   Chloride 98 - 111 mmol/L 105   CO2 22 - 32 mmol/L 24   Calcium 8.9 - 10.3 mg/dL 8.5    Edinburgh Score:    01/16/2019   11:40 AM  Edinburgh Postnatal Depression Scale Screening Tool  I have been able to laugh and see the funny side of things. 0  I have looked forward with enjoyment to things. 0  I have blamed myself unnecessarily when things went wrong. 2  I have been anxious or worried for no good reason. 0  I have felt scared or panicky for no good reason. 0  Things have been getting on top of me. 1  I have been so unhappy that I have had difficulty sleeping. 0  I have felt sad or miserable. 0  I have been so unhappy that I have been crying. 0  The thought of harming myself has occurred to me. 0  Edinburgh Postnatal Depression Scale Total 3   No data recorded  After visit meds:  Allergies as of 02/14/2023       Reactions   Nitrofurantoin Hives, Nausea And Vomiting   Penicillins Hives   States she was young but what she remembers is swelling  and fever     Med Rec must be completed prior to using this [facility]***        Discharge home in stable condition Infant Feeding: {Baby feeding:23562} Infant Disposition:{CHL IP OB HOME WITH ZOXWRU:04540} Discharge instruction: per After Visit Summary and Postpartum booklet. Activity: Advance as tolerated. Pelvic rest for 6 weeks.  Diet: {OB JWJX:91478295} Future Appointments:No future appointments. Follow up Visit: Sent message to [facility] 10/21   Please schedule this patient for a In person postpartum  visit in 4 weeks with the following provider: Any provider. Additional Postpartum F/U:Postpartum Depression checkup, 2 hour GTT, and BP check 1 week  High risk pregnancy complicated by:  no prenatal care, hx of drug use Delivery mode:  Vaginal, Spontaneous Anticipated Birth Control:  Unsure   02/14/2023 Hessie Dibble, MD

## 2023-02-14 NOTE — MAU Note (Signed)
1525 emergency call to security desk , pt in car with complaint of labor and ROM x 24 hours. Assisted to wheelchair x 3 and taken to room 122 with dr Judd Lien at bedside. Per sve pt complete/+1. FHT's obtained and pt to room 205 per stretcher. Pt reports no prenatal care

## 2023-02-14 NOTE — Progress Notes (Signed)
Pt reports occasional marijuana and cocaine use

## 2023-02-15 ENCOUNTER — Telehealth: Payer: Self-pay | Admitting: Neonatology

## 2023-02-15 LAB — GC/CHLAMYDIA PROBE AMP (~~LOC~~) NOT AT ARMC
Chlamydia: POSITIVE — AB
Comment: NEGATIVE
Comment: NORMAL
Neisseria Gonorrhea: NEGATIVE

## 2023-02-15 LAB — COMPREHENSIVE METABOLIC PANEL
ALT: 13 U/L (ref 0–44)
AST: 17 U/L (ref 15–41)
Albumin: 1.8 g/dL — ABNORMAL LOW (ref 3.5–5.0)
Alkaline Phosphatase: 192 U/L — ABNORMAL HIGH (ref 38–126)
Anion gap: 8 (ref 5–15)
BUN: <5 mg/dL — ABNORMAL LOW (ref 6–20)
CO2: 21 mmol/L — ABNORMAL LOW (ref 22–32)
Calcium: 7.4 mg/dL — ABNORMAL LOW (ref 8.9–10.3)
Chloride: 107 mmol/L (ref 98–111)
Creatinine, Ser: 0.8 mg/dL (ref 0.44–1.00)
GFR, Estimated: >60 mL/min (ref >60)
Glucose, Bld: 103 mg/dL — ABNORMAL HIGH (ref 70–99)
Potassium: 3.5 mmol/L (ref 3.5–5.1)
Sodium: 136 mmol/L (ref 135–145)
Total Bilirubin: 0.1 mg/dL — ABNORMAL LOW (ref 0.3–1.2)
Total Protein: 6.1 g/dL — ABNORMAL LOW (ref 6.5–8.1)

## 2023-02-15 LAB — RPR
RPR Ser Ql: REACTIVE — AB
RPR Titer: 1:16 {titer}

## 2023-02-15 LAB — CBC
HCT: 35.8 % — ABNORMAL LOW (ref 36.0–46.0)
Hemoglobin: 11.5 g/dL — ABNORMAL LOW (ref 12.0–15.0)
MCH: 25.4 pg — ABNORMAL LOW (ref 26.0–34.0)
MCHC: 32.1 g/dL (ref 30.0–36.0)
MCV: 79 fL — ABNORMAL LOW (ref 80.0–100.0)
Platelets: 299 10*3/uL (ref 150–400)
RBC: 4.53 MIL/uL (ref 3.87–5.11)
RDW: 14.4 % (ref 11.5–15.5)
WBC: 11.1 10*3/uL — ABNORMAL HIGH (ref 4.0–10.5)
nRBC: 0.2 % (ref 0.0–0.2)

## 2023-02-15 MED ORDER — DOXYCYCLINE HYCLATE 100 MG PO TABS
100.0000 mg | ORAL_TABLET | Freq: Two times a day (BID) | ORAL | Status: DC
  Administered 2023-02-16 – 2023-02-17 (×4): 100 mg via ORAL
  Filled 2023-02-15 (×4): qty 1

## 2023-02-15 NOTE — Telephone Encounter (Signed)
Met with patient at her bedside to establish relationship with NAS team. Bronson Ing verbalized it was ok to discuss her medical history with her mom in the room (she was asleep at the time of consult). Bita was very open about her usage of cocaine. She was tearful during our discussion and stated that "could have done better." I expressed my appreciation for her honesty and offered comfort as she processed the delivery of her son. Infant is currently in the NICU due to suspected early gestation and need intensive care observation. I gave her amn update on how the infant was doing and discussed the SUD clinic as well as NAS consult phone number. Alliene agreed to further communication from SUD team members. I expressed my appreciate for her time and highlighted the NAS phone number. If she needs anything in the meantime please encourage her to reach out to NAS team member at [phone].    Natalia [facility]) Alen Bleacher, NNP-BC

## 2023-02-15 NOTE — Progress Notes (Signed)
RN called to inform MD that the patient has left the hospital after being educated and has been gone for more than 2 hours. RN also informed MD that after talking with the women's [facility] and reviewing the policy the patient will have to be discharged AMA.

## 2023-02-15 NOTE — Progress Notes (Addendum)
This RN and security explained situation to [employer]. Pt has eloped and staff has been unable to contact patient via telephone despite multiple attempts with multiple phone numbers. A search of the hospital and grounds has been conducted. Pt's mother still present in room but unable to contact patient. RN concerns about pt due to known history of drug use.

## 2023-02-15 NOTE — Progress Notes (Addendum)
Post Partum Day 1 s/p SVD Subjective: Reports mild HA and fatigue. Otherwise no complaints. Tolerating PO, voiding spontaneously, no HVB.   Objective: Blood pressure 122/77, pulse 85, temperature 97.9 F (36.6 C), temperature source Oral, resp. rate 20, height 5\' 3"  (1.6 m), weight 56.7 kg, SpO2 97%, unknown if currently breastfeeding.  Physical Exam:  General: alert, cooperative, and no distress Lochia: appropriate Uterine Fundus: firm Incision: n/a DVT Evaluation: No evidence of DVT seen on physical exam.  Recent Labs    02/14/23 1718 02/15/23 0416  HGB 12.1 11.5*  HCT 37.1 35.8*    Assessment/Plan: Postpartum - Contraception: considering Nexplanon - MOF: formula - Rh status: Rh+ - Rubella status: unknown, lab ordered - Dispo: likely discharge home PPD2-3 - Consults: SW  Neonatal - In NICU - Pt's brother has custody of pt's other children  3. 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 Substance use disorder - UDS +cocaine - pt endorses unprescribed pain medication use and cocaine use in the past year - SW consulted  6. +RPR - TPPA pending   LOS: 1 day   Lennart Pall 02/15/2023, 11:23 AM

## 2023-02-15 NOTE — Progress Notes (Signed)
Consulting civil engineer and this RN notified pt's mother that she will be discharged AMA due to prolonged absence from hospital grounds and inability to contact patient. Pt's mother will vacate room at this time with patient's belongings. Car seat present in patient's room taken to NICU to infants room and given to staff there.

## 2023-02-15 NOTE — Progress Notes (Signed)
Pt remains absent from room.  

## 2023-02-15 NOTE — Progress Notes (Signed)
[employer] requested to come to [facility] by security supervisor.

## 2023-02-15 NOTE — Clinical Social Work Maternal (Signed)
CLINICAL SOCIAL WORK MATERNAL/CHILD NOTE  Patient Details  Name: Rachel Stanley MRN: [id number] Date of Birth: 08-29-1985  Date:  02/15/2023  Clinical Social Worker Initiating Note:  Vivi Barrack, [place] Date/Time: Initiated:  02/15/23/0915     Child's Name:  Corliss Skains   Biological Parents:  Mother, Father ([name]: Rachel Stanley 09/16/1984, Demetrius Black 09/05/1972)   Need for Interpreter:  None   Reason for Referral:  Late or No Prenatal Care  , Behavioral Health Concerns, Current Substance Use/Substance Use During Pregnancy     Address:  [address] [place] [address]    Phone number:  [phone]  Additional phone number:   Household Members/Support Persons (HM/SP):   Household Member/Support Person 1   HM/SP Name Relationship DOB or Age  HM/SP -1 Wilber Bihari Mother [birth year] 1963  HM/SP -2        HM/SP -3        HM/SP -4        HM/SP -5        HM/SP -6        HM/SP -7        HM/SP -8          Natural Supports (not living in the home):  Children, Spouse/significant other   Professional Supports: Organized support group (Comment) ([facility] [place] [facility]   Employment: Unemployed   Type of Work:     Education:  Engineer, agricultural   Homebound arranged:    Surveyor, quantity Resources:  [employer], Media planner    Other Resources:      Cultural/Religious Considerations Which May Impact Care:  Christian   Strengths:  Ability to meet basic needs  , Pediatrician chosen   Psychotropic Medications:         Pediatrician:    Armed forces operational officer area  Pediatrician List:   [facility]  [place]    [place]    [place] [facility]      Pediatrician Fax Number:    Risk Factors/Current Problems:  Substance Use  , Mental Health Concerns     Cognitive State:  Goal Oriented  , Insightful  , Able to Concentrate     Mood/Affect:  Comfortable  , Calm  , Interested      CSW Assessment: CSW received consult for no prenatal care, substance use during the pregnancy, and NICU admission.  CSW met with [name] to offer support and complete assessment.    CSW met with [name] at bedside and introduced CSW role. CSW observed [name] eating breakfast and maternal grandmother Wilber Bihari; 12/[birth year]/1963) at bedside. [name] presented calm and receptive to the visit. [name] was alert and oriented. CSW offered [name] privacy and reiterated the sensitive nature of the assessment. [name] stated, "you can talk with her (mother) in the room she knows about everything anyway." CSW proceeded with the assessment and asked the infant's name. [name] reported the infant's name is [employer]. Maternal grandmother politely interjected and stated, "this was a stressful birth, she does not plan to have anymore." [name] agreed with her mother and stated, "this is it." CSW asked if the demographic information on file was correct. [name] stated that she does not live at the address listed on file. She lives with her mother at [address] [place], [place] [address] and her contact number is [phone]. [name] stated that she has a high school education and is currently unemployed. [name]  reported that she tried to find a job during the pregnancy but was unsuccessful. She hopes to find employment soon to help care for her children. CSW inquired if [name] received WIC and food stamps. [name] stated that she tried to apply for [facility] and food stamps at the [facility] last week, but she started having pain and did not follow up. She stated that she would like to apply now. CSW provided [name] with [facility] WIC/FS resources to follow up. CSW offered [name] employment resources. [name] accepted the resources.   CSW inquired about FOB. [name] stated that FOB is Demetrius Black (09/05/1972) and that they agreed that he would take the infant home while she goes to substance use treatment. [name] stated that FOB does not use drugs and that he has six  other children. CSW inquired about [name] older children. [name] stated that her children (Demise Katrinka Blazing; 01/23/16, Faye Ramsay; 10/25/05) are in the custody of sister Crystal Corporan and she has weekly visitation. [name] stated her son Cammy Copa; 06/02/2009) lives with [employer]). CSW asked for clarification. [name] stated that her son Lurlean Horns lives with her and is currently in jail for armed robbery. [name] stated her son Maryan Rued; 01/14/2019) lives in [place] with her brother, Shalae Lamke. CSW inquired if [name] had CPS history. [name] stated that her children were removed from her custody by Child Protective Services in [place] (2017) due to history of drug use. She also had an open case in 2020 with [facility] due to drug use and she chose to have her brother care for her son Donyx,and that she still has custody of the child.   CSW inquired why [name] did not receive prenatal care. [name] stated that she tried to get an appointment at the doctor's office "near [facility]" but missed the window for new patient appointments. [name] stated that she was able to go to one visit at [facility] but did not continue care because she did not have a photo ID. CSW informed [name] about the hospital drug screen policy for patients that do not receive prenatal care or have less than three visits. CSW informed [name] that her urine drug screen resulted positive for cocaine and the infant's urine drug screen resulted positive for cocaine. [name] was forthcoming and stated that she used cocaine every day during the pregnancy. The last time she used was the day before yesterday (02/13/2023) when she started to have pain from the contractions. CSW made [name] aware that CSW will make a report to [facility] because infant's urine was positive for cocaine and continue to follow the infant's cord drug and make a report, if warranted. [name] reported  understanding. [name] denied marijuana use, alcohol use and any other substances use during the pregnancy. [name] shared that she tried to stop using cocaine during the pregnancy, "there were days that I said no to my 400 dollars a day habit." [name] stated that her boyfriend supports her habit. He is not FOB.   CSW inquired about [name] treatment history. [name] reported she is currently in treatment at [facility] of the [place] [facility] (Substance use Intensive Outpatient Program) and she sees case worker, [place]. [name] stated the last time she attended her SA support group was when she was about five months pregnant because she got lazy and stopped going. [name] stated that she is "tired of using" and expressed interest in a residential treatment program. [name] shared  that she just wants to be a mother and care for her children. CSW provided active listening and acknowledged [name] readiness to change. CSW discussed residential treatment options and provided [name] with a list of perinatal and maternal substance use treatment programs and residential options in the area. CSW encouraged [name] to follow up with the residential substance use treatment facilities. [name] stated that she would start calling tomorrow.   CSW inquired about [name] mental health history. [name] stated that she was diagnosed with Bipolar II, PTSD, anxiety, and major depression. [name] expressed "I know when something is not right, I feel my face getting hot and I get mad, or I give into the sadness." [name] shared that she has not seen her psychiatrist at [facility] in over a year. She was prescribed psychotropic medication Prozac and Zoloft which she felt helped her symptoms. [name] stated that she had postpartum depression after giving birth to her last child that lasted for two months as evidenced by her feeling stressed, sad, and going through a lot with the Department of Social Services/Child Management consultant. CSW discussed mental health follow up  and postpartum depression. CSW provided education regarding the baby blues period vs. perinatal mood disorders, discussed treatment and gave resources. CSW encouraged [name] to follow up with [facility] [place]. CSW recommended complete a self-evaluation during the postpartum time period using the New Mom Checklist from Postpartum Progress and encouraged [name] to contact a medical professional if symptoms are noted at any time. CSW assessed [name] for safety. [name] denied thoughts of harm to self and others. [name] denied domestic violence.   CSW inquired about [name] supports. [name] identified her mother; FOB and her oldest child Destiny Shimamoto as supports. [name] shared, "Babette Relic is the levelheaded one." CSW encouraged [name] rely on her supports for help during this time. [name] expressed that she was ready to hold her son and let her older children know that he was born. CSW asked if [name] had items to care for the baby. [name] stated that she had a car seat, and that FOB would purchase the other essential items for him. [name] has chosen [facility] for the infant's follow up. CSW provided review of Sudden Infant Death Syndrome (SIDS) precautions. [name] reported understanding.   CSW made a report to [facility] CPS intake Loralyn Freshwater. CSW faxed infant's UDS and the assessment.   CSW will continue to offer support and resources to family while infant remains in NICU.      CSW Plan/Description:  Psychosocial Support and Ongoing Assessment of Needs, Perinatal Mood and Anxiety Disorder (PMADs) Education, Hospital Drug Screen Policy Information, Child Protective Service Report  , CSW Will Continue to Monitor Umbilical Cord Tissue Drug Screen Results and Make Report if Warranted, Sudden Infant Death Syndrome (SIDS) Education, Other Information/Referral to [employer], LCSW 02/15/2023, 3:19 PM

## 2023-02-15 NOTE — Progress Notes (Addendum)
Requested for security supervisor Loraine Leriche to come to [facility] to assist [facility].

## 2023-02-15 NOTE — Progress Notes (Signed)
Security unable to locate patient on grounds.

## 2023-02-15 NOTE — Lactation Note (Signed)
This note was copied from a baby'Stanley chart.  NICU Lactation Consultation Note  Patient Name: Rachel Stanley [id number] Date: 02/15/2023 Age:38 hours  Reason for consult: Initial assessment; NICU baby; Preterm <34wks; Other (Comment); Infant < 6lbs (AMA, No PNC,  THC and cocaine (+))  SUBJECTIVE Spoke to [facility] RN Rachel Stanley and she confirmed that we won't be using mother'Stanley milk due to Hx of substance use during the pregnancy. Lactation services are completed at this time.  OBJECTIVE Infant data: Mother'Stanley Current Feeding Choice: -- (NPO)  O2 Device: Room Air FiO2 (%): 21 %  Infant feeding assessment No data recorded  Maternal data: [id number] Vaginal, Spontaneous  ASSESSMENT Infant: Feeding Status: NPO  Maternal: No data recorded  INTERVENTIONS/PLAN Interventions: No data recorded Plan: Consult Status: Complete   Rachel Stanley Rachel Stanley 02/15/2023, 10:00 AM

## 2023-02-15 NOTE — Progress Notes (Addendum)
RN observed Pt. walk down sidewalk and get into a car after RN educated Pt. not to leave the hospital while still admitted. Charge nurse spoke with [facility] and instructed RN to call Pt. Three times 15 minutes apart. RN attempted to call phone numbers listed in chart. Both phone numbers are currently not in service.

## 2023-02-15 NOTE — Progress Notes (Signed)
Security searching for patient on grounds using moblie unit. Notified Dentist of potential need to call PD due to missing patient. Pt's mother still present in room. I spoke to patient's mother who states "She just went out to smoke". Pt's mother did not know of a way to contact patient. Asked patient's mother about earlier tonight. She states she and the patient left in a car with the baby's father to go get a car seat and then returned. I asked her did the patient get back out of the car and come in the hospital when she herself returned. She stated she was unsure.

## 2023-02-15 NOTE — Progress Notes (Signed)
[facility] alerted security to the Pt. Absence. RN tried to call an additional number [phone]) found in the SW note and the call could not be completed. RN noticed Pt. Mother laying on couch in Pt. room. RN asked mother where the patient is and she says she does not know. RN also contacted the Pt. brother to see if he has another way to contact the Pt. or if he knows where the Pt. may have gone. Pt. brother is unaware of pt. contact info or where she could be located.

## 2023-02-16 ENCOUNTER — Other Ambulatory Visit (HOSPITAL_COMMUNITY): Payer: Self-pay

## 2023-02-16 DIAGNOSIS — Z30017 Encounter for initial prescription of implantable subdermal contraceptive: Secondary | ICD-10-CM

## 2023-02-16 LAB — RUBELLA SCREEN: Rubella: 3.47 {index} (ref >0.99)

## 2023-02-16 LAB — SURGICAL PATHOLOGY

## 2023-02-16 LAB — T.PALLIDUM AB, TOTAL: T Pallidum Abs: REACTIVE — AB

## 2023-02-16 MED ORDER — ETONOGESTREL 68 MG ~~LOC~~ IMPL
68.0000 mg | DRUG_IMPLANT | Freq: Once | SUBCUTANEOUS | Status: AC
  Administered 2023-02-16: 68 mg via SUBCUTANEOUS
  Filled 2023-02-16: qty 1

## 2023-02-16 MED ORDER — ALUM & MAG HYDROXIDE-SIMETH 200-200-20 MG/5ML PO SUSP: 30.0000 mL | ORAL | Status: DC | PRN

## 2023-02-16 MED ORDER — NIFEDIPINE ER 30 MG PO TB24
30.0000 mg | ORAL_TABLET | Freq: Every day | ORAL | 1 refills | Status: AC
  Filled 2023-02-16: qty 30, 30d supply, fill #0
  Filled 2023-02-16: qty 20, 20d supply, fill #0

## 2023-02-16 MED ORDER — DOXYCYCLINE HYCLATE 100 MG PO TABS
100.0000 mg | ORAL_TABLET | Freq: Two times a day (BID) | ORAL | 0 refills | Status: DC
  Filled 2023-02-16: qty 12, 6d supply, fill #0

## 2023-02-16 MED ORDER — LIDOCAINE HCL 1 % IJ SOLN
INTRAMUSCULAR | Status: AC
  Filled 2023-02-16: qty 20

## 2023-02-16 MED ORDER — FAMOTIDINE 20 MG PO TABS
20.0000 mg | ORAL_TABLET | Freq: Every day | ORAL | Status: DC
  Administered 2023-02-17: 20 mg via ORAL
  Filled 2023-02-16 (×2): qty 1

## 2023-02-16 MED ORDER — FUROSEMIDE 20 MG PO TABS
20.0000 mg | ORAL_TABLET | Freq: Every day | ORAL | 0 refills | Status: AC
  Filled 2023-02-16: qty 5, 5d supply, fill #0

## 2023-02-16 MED ORDER — LIDOCAINE HCL 1 % IJ SOLN
0.0000 mL | Freq: Once | INTRAMUSCULAR | Status: AC | PRN
  Administered 2023-02-16: 20 mL via INTRADERMAL

## 2023-02-16 NOTE — Social Work (Signed)
CSW contacted [facility] CPS intake and the notified the report was screened in as 24 hour response time and assigned to CPS social worker Tawanna Cooler [place], [phone]) and CPS supervisor Glee Arvin [place], [phone]).   CSW will provide updates when a discharge plan has been determined for the infant.   Vivi Barrack, MSW, LCSW [facility]  Clinical Social Worker  02/16/2023  8:47 AM

## 2023-02-16 NOTE — Social Work (Addendum)
[facility] CPS social worker Lanell Persons, [phone]) contacted CSW and stated that she is in route to the hospital to complete an assessment with [name].   Vivi Barrack, MSW, LCSW [facility]  Clinical Social Worker  02/16/2023  12:05 PM

## 2023-02-16 NOTE — Social Work (Deleted)
CSW met with [name] at bedside to offer support and assess for needs, concerns, and resources. CSW observed [name] holding the infant and bonding with the infant as evidenced by her reading to infant. CSW asked [name] how she has been doing. [name] reported that she has been doing fine and shared that Valentina Gu transition from an isolette to a bassinet. CSW acknowledged statement. CSW asked if [name] had PPD symptoms. [name] stated that she feels anxious at times and treats her symptoms with psychotropic medication which she feels is helping. [name] reported no other MH concerns. CSW assessed [name] for additional needs. [name] reported no further need.    CSW will continue to offer support and resources to family while infant remains in NICU.   Vivi Barrack, MSW, LCSW [facility]  Clinical Social Worker  02/16/2023  11:58 AM

## 2023-02-16 NOTE — Progress Notes (Addendum)
Post Partum Day 2 s/p SVD Subjective: Reports epigastric pain that she attributes to gas. Sharp. Worse after eating, better with bowel movement. Voiding, passing flatus. No nausea/vomiting.   Would like to go to an inpatient treatment facility/rehabilitation. Working with SW and NICU, but has not found a safe place to go yet.   Objective: Blood pressure (!) 115/97, pulse 93, temperature 98 F (36.7 C), temperature source Oral, resp. rate 19, height 5\' 3"  (1.6 m), weight 56.7 kg, SpO2 98%, unknown if currently breastfeeding.  Physical Exam:  General: alert, cooperative, and no distress Lochia: appropriate Abdomen: soft, mildly distended, mildly diffusely tender, no rebound/guarding Uterine Fundus: firm, non tender Incision: n/a DVT Evaluation: No evidence of DVT seen on physical exam.  Recent Labs    02/14/23 1718 02/15/23 0416  HGB 12.1 11.5*  HCT 37.1 35.8*    Assessment/Plan: Postpartum Pepcid, maalox/mylanta prn and simethicone prn for likely GI-related discomfort. If persistent, will obtain repeat preE labs - Contraception: s/p Nexplanon (see procedure note) - MOF: formula - Rh status: Rh+ - Rubella status: unknown, lab ordered - Dispo: likely discharge PPD3. Message routed to [facility] to schedule PP follow up appointments with [facility] in 1 and 2 weeks - Consults: SW  Neonatal - In NICU - Pt's brother has custody of pt's other children - Actively working with NICU/SW for safe plan of care  3. Preeclampsia with SF (BP) - asymptomatic - s/p mag x 24h PP - BP largely normotensive but significant range (104-152/69-97) on procardia XL 30mg  daily. 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 Substance use disorder - UDS +cocaine - pt endorses unprescribed pain medication use and cocaine use in the past year - SW consulted  6. +RPR -  pt aware - TPPA pending. HD has no records of treatment or +RPR. Was nonreactive in 2022 - Pt reports PCN allergy of nausea/vomiting, but no hives or anaphylaxis. Will work with pharmacy for test dose recommendations if needed  7. Chlamydia - pt aware - doxycycline 100mg  BID x 7d - meds to beds on discharge   LOS: 2 days   Lennart Pall 02/16/2023, 2:27 PM

## 2023-02-16 NOTE — Social Work (Signed)
CSW notified CPS [facility] CPS social worker Rachel Stanley, [phone]) about the concerns that were presented to CSW by RN shared by maternal uncle Rachel Stanley.   CSW escorted the CPS social worker to the NICU to infant's bedside room 3S05. RN was not available to provide updates at bedside. CPS social worker stated that RN could call her with updates.   Per CPS social worker Rachel Stanley,  [name] can still visit with the infant as long as [name] is sober. CSW explained the NICU staff is not able to determine if [name] is sober. CPS social worker reported that FOB and family can still visit the infant. CPS social worker stated that they are actively working with [name] to determine a safe discharge plan for the infant.   CSW provided RN with CPS social worker contact information to provide a medical update.  CSW reached out to CPS social worker Rachel Stanley supervisor Rachel Stanley [phone]) to discuss safety concerns regarding [name]'s unsupervised visitation with the infant  after [name] admitted to the CPS social worker that she was actively withdrawing. CPS supervisor voicemail stated that she will be out of the office until 03-11-23 and to call supervisor Rachel Stanley [phone]) CSW received a call back from [facility] CPS Program Manager Rachel Stanley [phone]).  CSW informed CPS Program Manager about the safety concerns. She acknowledged the safety concerns and stated that she would follow up with the CPS social worker Rachel Stanley and her current supervisor.  CPS Program Manager reported that someone from CPS would contact CSW with any updates.   CSW awaiting return call from CPS regarding current safety concerns.   Barriers to infant's discharge.   Vivi Barrack, MSW, LCSW [facility]  Clinical Social Worker  03/05/23  2:47 PM

## 2023-02-16 NOTE — Social Work (Signed)
Per medical staff request, CSW met with [name] and family to offer support. Present at the infant's bedside was [name], maternal grandmother, maternal uncle, maternal uncles' wife, and the medical team. [name] expressed that she wanted the infant to discharge home with her brother because she is not sure if FOB will be involved. [name] stated that FOB can make decisions for the infant if he chooses to be involved. CSW explained to [name] that CPS would complete an assessment with her and assist with determining a safe discharge plan for the infant. [name] reported understanding.   Vivi Barrack, MSW, LCSW [facility]  Clinical Social Worker  03-07-23  12:17 PM

## 2023-02-16 NOTE — Progress Notes (Signed)
Patient returned to Hospital at 0000. Security called Education officer, environmental and House Coverage to come speak with patient. Patient states "I fell asleep in parking garage". Given AMA protocol this evening, Patient brought back to MAU with House Coverage. Patient "readmitted" onto [facility] for continued Care. Attending MD made aware.

## 2023-02-16 NOTE — Social Work (Signed)
CSW escorted [facility] CPS social worker Lanell Persons, [phone]) to [facility] room 1S09 to complete an assessment.   Vivi Barrack, MSW, LCSW [facility]  Clinical Social Worker  21-Sep-2022  12:42 PM

## 2023-02-16 NOTE — Procedures (Signed)
   NEXPLANON PROCEDURE NOTE  Rachel Stanley is a 37 y.o. [phone] requesting Nexplanon for postpartum contraception  Nexplanon Insertion Procedure Patient identified, informed consent performed, consent signed.   Patient does understand that irregular bleeding is a very common side effect of this medication. She was advised to have backup contraception for one week after placement.   Patient's left arm was prepped and draped in the usual sterile fashion. The insertion area was marked.  Patient was prepped with alcohol swab and then injected with 3 ml of 1% lidocaine.  She was prepped with betadine, Nexplanon removed from packaging,  Device confirmed in needle, then inserted full length of needle and withdrawn per handbook instructions. Nexplanon was able to palpated in the patient's arm; patient palpated the insert herself. There was minimal blood loss.  Patient insertion site covered with guaze and a pressure bandage to reduce any bruising.    The patient tolerated the procedure well and was given post procedure instructions.   Harvie Bridge, MD Obstetrician & Gynecologist, [facility] for [employer], [facility]

## 2023-02-17 ENCOUNTER — Encounter (HOSPITAL_COMMUNITY): Payer: Self-pay | Admitting: Family Medicine

## 2023-02-17 ENCOUNTER — Other Ambulatory Visit (HOSPITAL_COMMUNITY): Payer: Self-pay

## 2023-02-17 DIAGNOSIS — A749 Chlamydial infection, unspecified: Secondary | ICD-10-CM

## 2023-02-17 DIAGNOSIS — A539 Syphilis, unspecified: Secondary | ICD-10-CM

## 2023-02-17 HISTORY — DX: Syphilis, unspecified: A53.9

## 2023-02-17 HISTORY — DX: Chlamydial infection, unspecified: A74.9

## 2023-02-17 MED ORDER — IBUPROFEN 600 MG PO TABS
600.0000 mg | ORAL_TABLET | Freq: Four times a day (QID) | ORAL | 2 refills | Status: AC | PRN
  Filled 2023-02-17: qty 30, 8d supply, fill #0

## 2023-02-17 MED ORDER — DOXYCYCLINE HYCLATE 100 MG PO TABS
100.0000 mg | ORAL_TABLET | Freq: Two times a day (BID) | ORAL | 0 refills | Status: AC
  Filled 2023-02-17: qty 56, 28d supply, fill #0

## 2023-02-17 NOTE — Progress Notes (Signed)
Patient given discharge instructions and denies any questions or concerns. Patient ambulatory off unit at this time. 

## 2023-02-17 NOTE — Social Work (Addendum)
CSW contacted [facility] CPS social worker Lanell Persons, [phone]) to follow up regarding the safety plan.   Per CPS social worker, [name] and FOB must be supervised by hospital staff or an individual from [facility] CPS during visitation with the infant. If hospital staff or CPS is not available to supervise during the visit, then [name] and FOB cannot visit with the infant.   Barriers to infant's discharge.   Vivi Barrack, MSW, LCSW [facility]  Clinical Social Worker  04-Feb-2023  9:24 AM

## 2023-02-17 NOTE — Social Work (Signed)
CSW met with [name] at the bedside. CSW observed the maternal grandmother and FOB at the bedside. CSW asked how [name] was doing. [name] reported that she had reached out to the [facility] for services and they needed medical records for review. [name] provided CSW with the contact information to follow up.   CSW provided [name] with a release of consent form to sign for [facility]. [name] gave CSW permission to fax records to [facility]. CSW contacted [facility] and spoke with a representative Michelle B. Belenda Cruise. reported that they had received a call from [facility] seeking treatment and requested medical records. CSW asked if [name] would have a bed today. She reported that [name] would not have a bed today maybe tomorrow if she was cleared to come by their intake department. Marcelino Duster B. reported that she would notify CSW and [name] if the treatment center had a bed. CSW sent the requested records.   CSW followed up with [name] at the bedside. CSW explained that the medical records were sent, and that Marcelino Duster B. would reach out to CSW if [name] was accepted to begin treatment. CSW asked how [name] could be reached once discharged. FOB stated that they could call his phone to reach her. [name] reported that she was not sure where she and maternal grandmother would sleep for the night. CSW asked [name] is she had relatives or friends that she could stay with. [name] reported none. CSW asked if [name] could go to the shelter. [name] reported the shelter was not taking new residents and there was a sign posted that stated to go to the [facility]). [name] reported that she did not want to go to the [facility]. FOB reported that he could help pay for a "cheap hotel" for the night and get [name] to treatment tomorrow. [name] agreed to it and expressed concerns about seeing people there with whom she would use drugs. CSW offered emotional support and reminded [name] of her safety plan with CPS so that  she could continue to her Angela Nevin. [name] stated, "I will try my best." FOB reported that he would arrange a ride to get [name] to the treatment center if she was accepted.   [name] asked if they could have a meal voucher for food before she left. CSW provided (3) meal vouchers for [name], FOB and maternal grandmother and a bus pass for [name].   [facility] staff Marcelino Duster B. contacted CSW and said they had not yet received the medical records. CSW resent the records and confirmed they were received. CSW completed the intake referral form and sent it over. Marcelino Duster reported the treatment center would have a bed for [name] tomorrow 02/18/23 and that she had reached out to [name] to let her know. She reported that [name] agreed to start treatment tomorrow.

## 2023-02-17 NOTE — Social Work (Signed)
UPDATE: [facility] CPS social worker Lanell Persons, [phone]) contacted CSW and informed CSW that ALL family must be supervised by hospital staff or an individual from [facility] CPS during visitation with the infant. If hospital staff or CPS is not available to supervise during the visit, then family cannot visit with the infant.   Barriers to infant's discharge.    Vivi Barrack, MSW, LCSW [facility]  Clinical Social Worker  01-02-23  10:48 AM

## 2023-02-23 ENCOUNTER — Encounter: Payer: Self-pay | Admitting: *Deleted

## 2023-02-23 ENCOUNTER — Ambulatory Visit: Payer: 59

## 2023-02-23 ENCOUNTER — Telehealth: Payer: Self-pay | Admitting: *Deleted

## 2023-02-23 NOTE — Telephone Encounter (Signed)
Rachel Stanley BP check s/p delivery. I called her mobile/home  number and heard message not in service. I called her 2nd home/ mobile number and heard message call cannot be completed at this time. I called her emergency contact ( Mother) and heard message call cannot be completed at this time. I called her brother and he states she is not there, she is homeless and he doesn't know where she is.  I asked if he sees her to have her call us about appointment. I will send a MyChart message. Nancy Fetter

## 2023-03-08 ENCOUNTER — Telehealth (HOSPITAL_COMMUNITY): Payer: Self-pay | Admitting: *Deleted

## 2023-03-08 NOTE — Telephone Encounter (Signed)
03/08/2023  Name: Rachel Stanley MRN: [id number] DOB: 10/26/1985  Reason for Call:  Transition of Care Hospital Discharge Call  Contact Status: Patient Contact Status: Unable to contact "Number cannot be dialed" Language assistant needed:          Follow-Up Questions:    Inocente Salles Postnatal Depression Scale:  In the Past 7 Days:    PHQ2-9 Depression Scale:     Discharge Follow-up:    Post-discharge interventions: NA  Salena Saner, RN 03/08/2023 11:02

## 2023-03-17 ENCOUNTER — Institutional Professional Consult (permissible substitution): Payer: 59

## 2023-03-17 ENCOUNTER — Ambulatory Visit: Payer: 59 | Admitting: Family Medicine

## 2023-03-17 NOTE — Progress Notes (Deleted)
    Post Partum Visit Note  Rachel Stanley is a 37 y.o. [phone] female who presents for a postpartum visit. She is 4 weeks postpartum following a normal spontaneous vaginal delivery.  I have fully reviewed the prenatal and intrapartum course. The delivery was at unknown gestational weeks.  Anesthesia: none. Postpartum course has been ***. Baby is doing well***. Baby is feeding by bottle - {formula:72}. Bleeding {vag bleed:12292}. Bowel function is {normal:32111}. Bladder function is {normal:32111}. Patient {is/is not:9024} sexually active. Contraception method is {contraceptive method:5051}. Postpartum depression screening: {gen negative/positive:315881}.   The pregnancy intention screening data noted above was reviewed. Potential methods of contraception were discussed. The patient elected to proceed with No data recorded.    Health Maintenance Due  Topic Date Due   DTaP/Tdap/Td (1 - Tdap) Never done   Cervical Cancer Screening (HPV/Pap Cotest)  Never done   INFLUENZA VACCINE  11/25/2022   COVID-19 Vaccine (1 - 2023-24 season) Never done    {Common ambulatory SmartLinks:19316}  Review of Systems {ros; complete:30496}  Objective:  There were no vitals taken for this visit.   General:  {gen appearance:16600}   Breasts:  {desc; normal/abnormal/not indicated:14647}  Lungs: {lung exam:16931}  Heart:  {heart exam:5510}  Abdomen: {abdomen exam:16834}   Wound {Wound assessment:11097}  GU exam:  {desc; normal/abnormal/not indicated:14647}       Assessment:    There are no diagnoses linked to this encounter.  *** postpartum exam.   Plan:   Essential components of care per ACOG recommendations:  1.  Mood and well being: Patient with {gen negative/positive:315881} depression screening today. Reviewed local resources for support.  - Patient tobacco use? {tobacco use:25506}  - hx of drug use? {yes/no:25505}    2. Infant care and feeding:  -Patient currently breastmilk feeding?  {yes/no:25502}  -Social determinants of health (SDOH) reviewed in EPIC. No concerns***The following needs were identified***  3. Sexuality, contraception and birth spacing - Patient {DOES_DOES JYN:82956} want a pregnancy in the next year.  Desired family size is {NUMBER 1-10:22536} children.  - Reviewed reproductive life planning. Reviewed contraceptive methods based on pt preferences and effectiveness.  Patient desired {Upstream End Methods:24109} today.   - Discussed birth spacing of 18 months  4. Sleep and fatigue -Encouraged family/partner/community support of 4 hrs of uninterrupted sleep to help with mood and fatigue  5. Physical Recovery  - Discussed patients delivery and complications. She describes her labor as {description:25511} - Patient had a {CHL AMB DELIVERY:[id number]}. Patient had a {laceration:25518} laceration. Perineal healing reviewed. Patient expressed understanding - Patient has urinary incontinence? {yes/no:25515} - Patient {ACTION; IS/IS OZH:08657846} safe to resume physical and sexual activity  6.  Health Maintenance - HM due items addressed {Yes or If no, why not?:20788} - Last pap smear No results found for: "DIAGPAP" Pap smear {done:10129} at today's visit.  -Breast Cancer screening indicated? {indicated:25516}  7. Chronic Disease/Pregnancy Condition follow up: {Follow up:25499}   Center for [employer], [facility]
